# Patient Record
Sex: Female | Born: 1950 | Race: White | Hispanic: No | Marital: Married | Smoking: Never smoker
Health system: Southern US, Community
[De-identification: ages and names within clinical notes are randomized; demographics above are authoritative.]

## PROBLEM LIST (undated history)

## (undated) DIAGNOSIS — C50919 Malignant neoplasm of unspecified site of unspecified female breast: Secondary | ICD-10-CM

## (undated) DIAGNOSIS — F40240 Claustrophobia: Secondary | ICD-10-CM

## (undated) DIAGNOSIS — Z8739 Personal history of other diseases of the musculoskeletal system and connective tissue: Secondary | ICD-10-CM

## (undated) DIAGNOSIS — M1990 Unspecified osteoarthritis, unspecified site: Secondary | ICD-10-CM

## (undated) DIAGNOSIS — M797 Fibromyalgia: Secondary | ICD-10-CM

## (undated) DIAGNOSIS — D649 Anemia, unspecified: Secondary | ICD-10-CM

## (undated) HISTORY — DX: Personal history of other diseases of the musculoskeletal system and connective tissue: Z87.39

## (undated) HISTORY — PX: COLONOSCOPY: SHX174

## (undated) HISTORY — DX: Claustrophobia: F40.240

## (undated) HISTORY — PX: KNEE ARTHROSCOPY: SUR90

## (undated) HISTORY — PX: FINGER SURGERY: SHX640

## (undated) HISTORY — PX: KNEE SURGERY: SHX244

## (undated) HISTORY — PX: BUNIONECTOMY: SHX129

---

## 1999-05-07 ENCOUNTER — Encounter: Payer: Self-pay | Admitting: Obstetrics and Gynecology

## 1999-05-07 ENCOUNTER — Encounter: Admission: RE | Admit: 1999-05-07 | Discharge: 1999-05-07 | Payer: Self-pay | Admitting: Obstetrics and Gynecology

## 1999-07-26 ENCOUNTER — Other Ambulatory Visit: Admission: RE | Admit: 1999-07-26 | Discharge: 1999-07-26 | Payer: Self-pay | Admitting: Obstetrics and Gynecology

## 2000-06-24 ENCOUNTER — Encounter: Payer: Self-pay | Admitting: Obstetrics and Gynecology

## 2000-06-24 ENCOUNTER — Encounter: Admission: RE | Admit: 2000-06-24 | Discharge: 2000-06-24 | Payer: Self-pay | Admitting: Obstetrics and Gynecology

## 2011-07-30 ENCOUNTER — Other Ambulatory Visit: Payer: Self-pay | Admitting: Rheumatology

## 2011-07-30 DIAGNOSIS — M545 Low back pain, unspecified: Secondary | ICD-10-CM

## 2011-08-13 ENCOUNTER — Ambulatory Visit
Admission: RE | Admit: 2011-08-13 | Discharge: 2011-08-13 | Disposition: A | Discharge status: Home / Self Care | Payer: Private Health Insurance - Indemnity | Source: Ambulatory Visit | Attending: Rheumatology | Admitting: Rheumatology

## 2011-08-13 DIAGNOSIS — M545 Low back pain, unspecified: Secondary | ICD-10-CM

## 2011-11-22 ENCOUNTER — Ambulatory Visit (INDEPENDENT_AMBULATORY_CARE_PROVIDER_SITE_OTHER): Payer: Private Health Insurance - Indemnity | Admitting: Internal Medicine

## 2011-11-22 VITALS — BP 133/77 | HR 80 | Temp 98.1°F | Resp 18 | Ht 71.0 in | Wt 143.0 lb

## 2011-11-22 DIAGNOSIS — H9209 Otalgia, unspecified ear: Secondary | ICD-10-CM

## 2011-11-22 MED ORDER — NEOMYCIN-POLYMYXIN-HC 3.5-10000-1 OT SOLN: 3.0000 [drp] | Freq: Four times a day (QID) | OTIC | Status: AC

## 2011-11-24 DIAGNOSIS — M797 Fibromyalgia: Secondary | ICD-10-CM | POA: Insufficient documentation

## 2011-11-24 DIAGNOSIS — M5136 Other intervertebral disc degeneration, lumbar region: Secondary | ICD-10-CM | POA: Insufficient documentation

## 2011-11-24 DIAGNOSIS — M51369 Other intervertebral disc degeneration, lumbar region without mention of lumbar back pain or lower extremity pain: Secondary | ICD-10-CM | POA: Insufficient documentation

## 2011-11-24 NOTE — Progress Notes (Signed)
  Subjective:    Patient ID: Jodi Gutierrez, female    DOB: 1951/05/03, 61 y.o.   MRN: [id number]  HPIComplaining of otalgia for several days on the right Occasional rustling sound in the ear No decreased hearing/no URI or allergies/no recent infections anywhere/no dental problems Has had some trouble with shoulder pain on that side and posterior neck pain on that side with activity over the past few weeks  Medical history includes fibromyalgia and lumbar degenerative disc disease/relatively stable    Review of Systems     Objective:   Physical Exam Vital signs stable Right and left TMs and canals are clear Except for mild accumulation of flaky wax in the canal on the right/negative tragus pull/no other exudate or redness The nose is clear Oral pharynx is clear No anterior posterior cervical nodes/no thyromegaly Tender in the right trapezius/mild tenderness in the posterior shoulder with range of motion/ Tender along the right paracervical muscles posteriorly/neck range of motion is good       Assessment & Plan:  Problem #1 otalgia of uncertain cause Problem #2 neck pain Problem #3 right shoulder pain  Meds ordered this encounter  Medications  . amitriptyline (ELAVIL) 50 MG tablet    Sig: Take 50 mg by mouth at bedtime.---Continued  . estradiol (CLIMARA - DOSED IN MG/24 HR) 0.025 mg/24hr    Sig: Place 1 patch onto the skin once a week.----Continued  . traMADol (ULTRAM) 50 MG tablet----Continued    Sig: Take 50 mg by mouth every 6 (six) hours as needed.  . cyclobenzaprine (FLEXERIL) 10 MG tablet--Continued    Sig: Take 10 mg by mouth 3 (three) times daily as needed.  . neomycin-polymyxin-hydrocortisone (CORTISPORIN) otic solution    Sig: Place 3 drops into the right ear 4 (four) times daily.    Dispense:  10 mL    Refill:  0   If not resolved in 5-7 days recommend recheck/center if worse

## 2012-06-09 ENCOUNTER — Encounter: Payer: Self-pay | Admitting: Internal Medicine

## 2012-06-16 ENCOUNTER — Encounter: Payer: Self-pay | Admitting: Internal Medicine

## 2012-06-16 ENCOUNTER — Ambulatory Visit (AMBULATORY_SURGERY_CENTER): Payer: Private Health Insurance - Indemnity

## 2012-06-16 VITALS — Ht 71.0 in | Wt 151.8 lb

## 2012-06-16 DIAGNOSIS — Z1211 Encounter for screening for malignant neoplasm of colon: Secondary | ICD-10-CM

## 2012-06-16 DIAGNOSIS — Z8371 Family history of colonic polyps: Secondary | ICD-10-CM

## 2012-06-16 DIAGNOSIS — Z800 Family history of malignant neoplasm of digestive organs: Secondary | ICD-10-CM

## 2012-06-16 MED ORDER — MOVIPREP 100 G PO SOLR: ORAL | Status: DC

## 2012-06-30 ENCOUNTER — Ambulatory Visit (AMBULATORY_SURGERY_CENTER): Payer: Private Health Insurance - Indemnity | Admitting: Internal Medicine

## 2012-06-30 ENCOUNTER — Encounter: Payer: Self-pay | Admitting: Internal Medicine

## 2012-06-30 VITALS — BP 125/75 | HR 83 | Temp 96.9°F | Resp 13 | Ht 71.0 in | Wt 151.0 lb

## 2012-06-30 DIAGNOSIS — K633 Ulcer of intestine: Secondary | ICD-10-CM

## 2012-06-30 DIAGNOSIS — D126 Benign neoplasm of colon, unspecified: Secondary | ICD-10-CM

## 2012-06-30 DIAGNOSIS — Z800 Family history of malignant neoplasm of digestive organs: Secondary | ICD-10-CM

## 2012-06-30 DIAGNOSIS — Z1211 Encounter for screening for malignant neoplasm of colon: Secondary | ICD-10-CM

## 2012-06-30 MED ORDER — SODIUM CHLORIDE 0.9 % IV SOLN: 500.0000 mL | INTRAVENOUS | Status: DC

## 2012-06-30 NOTE — Progress Notes (Addendum)
Patient did not have preoperative order for IV antibiotic SSI prophylaxis. (G8918)  Patient did not experience any of the following events: a burn prior to discharge; a fall within the facility; wrong site/side/patient/procedure/implant event; or a hospital transfer or hospital admission upon discharge from the facility. (G8907)  

## 2012-06-30 NOTE — Progress Notes (Signed)
Called to room to assist during endoscopic procedure.  Patient ID and intended procedure confirmed with present staff. Received instructions for my participation in the procedure from the performing physician.  

## 2012-06-30 NOTE — Op Note (Signed)
[facility] [address] [place], [address]   COLONOSCOPY PROCEDURE REPORT  PATIENT: Jodi Gutierrez, Jodi Gutierrez  MR#: [id number] BIRTHDATE: 1950-12-09 , 61  yrs. old GENDER: Female ENDOSCOPIST: Hart Carwin, MD REFERRED BY:  Dr Orvan July PROCEDURE DATE:  06/30/2012 PROCEDURE:   Colonoscopy with biopsy ASA CLASS:   Class I INDICATIONS: Sister with colon cancer, brother with colon polyps MEDICATIONS: MAC sedation, administered by CRNA and propofol (Diprivan) 350mg  IV  DESCRIPTION OF PROCEDURE:   After the risks and benefits and of the procedure were explained, informed consent was obtained.  A digital rectal exam revealed no abnormalities of the rectum.    The LB [id number]-[id number]  endoscope was introduced through the anus and advanced to the cecum, which was identified by both the appendix and ileocecal valve .  The quality of the prep was excellent, using MoviPrep .  The instrument was then slowly withdrawn as the colon was fully examined.     COLON FINDINGS: A small ulcer /linear erosion was found in the proximal ascending colon.on top of ileo cecal valve  A biopsy of the area was performed using cold forceps. There was severer divericulosis of the sigmoid colon between 15- 25 cm, lumen was narrow, tortuous and there were deep diverticluti. it was very difficult to advance into descending colon    Retroflexed views revealed no abnormalities.     The scope was then withdrawn from the patient and the procedure completed.  COMPLICATIONS: There were no complications. ENDOSCOPIC IMPRESSION:  Severe diverticulosis of the sigmoid colon, partial colon obstruction, no active inflammation Small ulcer in the proximal ascending colon; biopsy of the area was performed using cold forceps  RECOMMENDATIONS: Await biopsy results add Metamucil 1 tsp daily   REPEAT EXAM: In 5 year(s)  for Colonoscopy.  cc:  _______________________________ eSignedHart Carwin, MD  06/30/2012 2:14 PM     PATIENT NAME:  Jodi Gutierrez, Jodi Gutierrez MR#: [id number]

## 2012-06-30 NOTE — Patient Instructions (Addendum)
YOU HAD AN ENDOSCOPIC PROCEDURE TODAY AT THE [facility]: Refer to the procedure report that was given to you for any specific questions about what was found during the examination.  If the procedure report does not answer your questions, please call your gastroenterologist to clarify.  If you requested that your care partner not be given the details of your procedure findings, then the procedure report has been included in a sealed envelope for you to review at your convenience later.  YOU SHOULD EXPECT: Some feelings of bloating in the abdomen. Passage of more gas than usual.  Walking can help get rid of the air that was put into your GI tract during the procedure and reduce the bloating. If you had a lower endoscopy (such as a colonoscopy or flexible sigmoidoscopy) you may notice spotting of blood in your stool or on the toilet paper. If you underwent a bowel prep for your procedure, then you may not have a normal bowel movement for a few days.  DIET: Your first meal following the procedure should be a light meal and then it is ok to progress to your normal diet.  A half-sandwich or bowl of soup is an example of a good first meal.  Heavy or fried foods are harder to digest and may make you feel nauseous or bloated.  Likewise meals heavy in dairy and vegetables can cause extra gas to form and this can also increase the bloating.  Drink plenty of fluids but you should avoid alcoholic beverages for 24 hours. Please, increase the fiber in your diet to prevent diverticulitis.  It's very important.  Soluable fiber is better.  ACTIVITY: Your care partner should take you home directly after the procedure.  You should plan to take it easy, moving slowly for the rest of the day.  You can resume normal activity the day after the procedure however you should NOT DRIVE or use heavy machinery for 24 hours (because of the sedation medicines used during the test).    SYMPTOMS TO REPORT IMMEDIATELY: A  gastroenterologist can be reached at any hour.  During normal business hours, 8:30 AM to 5:00 PM Monday through Friday, call [phone].  After hours and on weekends, please call the GI answering service at [phone] who will take a message and have the physician on call contact you.   Following lower endoscopy (colonoscopy or flexible sigmoidoscopy):  Excessive amounts of blood in the stool  Significant tenderness or worsening of abdominal pains  Swelling of the abdomen that is new, acute  Fever of 100F or higher FOLLOW UP: If any biopsies were taken you will be contacted by phone or by letter within the next 1-3 weeks.  Call your gastroenterologist if you have not heard about the biopsies in 3 weeks.  Our staff will call the home number listed on your records the next business day following your procedure to check on you and address any questions or concerns that you may have at that time regarding the information given to you following your procedure. This is a courtesy call and so if there is no answer at the home number and we have not heard from you through the emergency physician on call, we will assume that you have returned to your regular daily activities without incident.  SIGNATURES/CONFIDENTIALITY: You and/or your care partner have signed paperwork which will be entered into your electronic medical record.  These signatures attest to the fact that that the information above on  your After Visit Summary has been reviewed and is understood.  Full responsibility of the confidentiality of this discharge information lies with you and/or your care-partner.    Thank-you for choosing [place] for your medical care today.

## 2012-07-01 ENCOUNTER — Telehealth: Payer: Self-pay | Admitting: *Deleted

## 2012-07-01 NOTE — Telephone Encounter (Signed)
  Follow up Call-  Call back number 06/30/2012  Post procedure Call Back phone  # [phone]  Permission to leave phone message Yes     No answer, left message!!

## 2012-07-06 ENCOUNTER — Encounter: Payer: Self-pay | Admitting: Internal Medicine

## 2012-08-21 ENCOUNTER — Telehealth: Payer: Self-pay | Admitting: Physician Assistant

## 2012-08-21 MED ORDER — ESTRADIOL 0.025 MG/24HR TD PTWK: 1.0000 | MEDICATED_PATCH | TRANSDERMAL | Status: DC

## 2012-08-21 NOTE — Telephone Encounter (Signed)
Medication refilled per protocol. 

## 2013-02-26 ENCOUNTER — Telehealth: Payer: Self-pay | Admitting: *Deleted

## 2013-02-26 NOTE — Telephone Encounter (Signed)
Pt complains of continue problem with left foot and wishes to discuss with Dr Irving Shows. Last office visit 11/13/2012.  1157am Called pt, pt states she is driving, I quickly informed her it would be to her best advantage to schedule appt.  Pt agreed.

## 2013-03-04 ENCOUNTER — Ambulatory Visit (INDEPENDENT_AMBULATORY_CARE_PROVIDER_SITE_OTHER): Payer: Private Health Insurance - Indemnity

## 2013-03-04 ENCOUNTER — Encounter: Payer: Self-pay | Admitting: Podiatrist

## 2013-03-04 ENCOUNTER — Ambulatory Visit (INDEPENDENT_AMBULATORY_CARE_PROVIDER_SITE_OTHER): Payer: Private Health Insurance - Indemnity | Admitting: Podiatrist

## 2013-03-04 VITALS — BP 138/91 | HR 98 | Resp 16 | Ht 71.0 in | Wt 144.0 lb

## 2013-03-04 DIAGNOSIS — Z9889 Other specified postprocedural states: Secondary | ICD-10-CM

## 2013-03-04 NOTE — Patient Instructions (Signed)
I have referred you to Jodi Gutierrez for physical therapy And nerve desensitization for your left foot.

## 2013-03-04 NOTE — Progress Notes (Signed)
Subjective: Patient presents today for her postop followup regarding left foot bunion surgery. She's been having nerve pain on this left foot and she points to the first metatarsal head region medially. Patient has tried her TENS unit and relates minimal relief in pain. She describes pain is shooting sharp pain in nervelike pain. Objective: Neurovascular status is intact and unchanged. Excellent range of motion of the first metatarsophalangeal joint left is seen. Swelling is completely resolved on the left foot. Tingling and numbness experiencde with pressure along the medial head of the first metatarsal. X-rays are evaluated and reveal well-healed Austin bunion correction left foot. Assessment is neurapraxia status post bunion procedure left Plan: Recommended physical therapy with Irene Shipper. [facility].  A prescription was written for this physical therapy she was also instructed to take her TENS unit with her in case he may want to use it for therapy. She is to continue seeing him and if she does not have resolution symptoms she shall call and let me know. Discussed we may be able to go in and free the nerve that they have gotten scarred down after surgery. She demonstrates an understanding of this conversation and will be seen back when necessary  Marlowe Aschoff DPM

## 2013-06-04 ENCOUNTER — Telehealth: Payer: Self-pay | Admitting: *Deleted

## 2013-06-04 NOTE — Telephone Encounter (Signed)
Left message asking pt how she was doing after her PT, and to call with concerns.

## 2013-09-09 ENCOUNTER — Telehealth: Payer: Self-pay | Admitting: *Deleted

## 2013-09-09 NOTE — Telephone Encounter (Signed)
Pain has come back.  Can she send me back to [facility] again?  He was able to make pain go away in October.

## 2013-09-09 NOTE — Telephone Encounter (Signed)
Absolutely-- please send over a referral to [facility] please

## 2013-09-10 NOTE — Telephone Encounter (Signed)
I called and informed the patient that we are sending a referral to [facility] for physical therapy.  [facility] [address] [place], [place] [address] Ph: [phone]

## 2014-01-19 NOTE — H&P (Signed)
TOTAL KNEE ADMISSION H&P  Patient is being admitted for right total knee arthroplasty.  Subjective:  Chief Complaint:     Right knee OA / pain.  HPI: AFFIE GASNER, 63 y.o. female, has a history of pain and functional disability in the right knee due to arthritis and has failed non-surgical conservative treatments for greater than 12 weeks to include NSAID's and/or analgesics, corticosteriod injections, viscosupplementation injections, supervised PT with diminished ADL's post treatment and activity modification.  Onset of symptoms was gradual, starting  years ago with gradually worsening course since that time. The patient noted prior procedures on the knee to include  arthroscopy and menisectomy on the right knee(s).  Patient currently rates pain in the right knee(s) at 9 out of 10 with activity. Patient has night pain, worsening of pain with activity and weight bearing, pain that interferes with activities of daily living, pain with passive range of motion, crepitus and joint swelling.  Patient has evidence of periarticular osteophytes and joint space narrowing by imaging studies.  There is no active infection.  Risks, benefits and expectations were discussed with the patient.  Risks including but not limited to the risk of anesthesia, blood clots, nerve damage, blood vessel damage, failure of the prosthesis, infection and up to and including death.  Patient understand the risks, benefits and expectations and wishes to proceed with surgery.   PCP: Anthoney Harada, MD  D/C Plans:      Home with HHPT  Post-op Meds:       No Rx given  Tranexamic Acid:      To be given - IV    Decadron:      Is to be given  FYI:     Xarelto post-op(Doesn't tolerate ASA at all)  Norco post-op    Patient Active Problem List   Diagnosis Date Noted  . Fibromyalgia 11/24/2011  . Lumbar degenerative disc disease 11/24/2011   Past Medical History  Diagnosis Date  . H/O fibromyalgia   . Claustrophobia      Past Surgical History  Procedure Laterality Date  . Bunionectomy      bil  . Knee surgery      left / arthroscopic  . Finger surgery      right ring finger /1994  . Knee arthroscopy Right     No prescriptions prior to admission   Allergies  Allergen Reactions  . Silver Other (See Comments)    swelling and blisters  . Tape Rash and Other (See Comments)    redness  . Nickel   . Penicillins     History  Substance Use Topics  . Smoking status: Never Smoker   . Smokeless tobacco: Never Used  . Alcohol Use: 0.6 oz/week    1 Glasses of wine per week     Comment: four times a week    Family History  Problem Relation Age of Onset  . Colon polyps Brother   . Colon cancer Sister      Review of Systems  Constitutional: Negative.   HENT: Negative.   Eyes: Negative.   Respiratory: Negative.   Cardiovascular: Negative.   Gastrointestinal: Negative.   Genitourinary: Negative.   Musculoskeletal: Positive for back pain, joint pain and myalgias.  Skin: Negative.   Neurological: Negative.   Endo/Heme/Allergies: Negative.   Psychiatric/Behavioral: Negative.     Objective:  Physical Exam  Constitutional: She is oriented to person, place, and time. She appears well-developed and well-nourished.  HENT:  Head: Normocephalic and atraumatic.  Mouth/Throat: Oropharynx is clear and moist.  Eyes: Conjunctivae are normal. Pupils are equal, round, and reactive to light.  Neck: Neck supple. No JVD present. No tracheal deviation present. No thyromegaly present.  Cardiovascular: Normal rate, regular rhythm, normal heart sounds and intact distal pulses.   Respiratory: Effort normal and breath sounds normal. No stridor. No respiratory distress. She has no wheezes.  GI: Soft. There is no tenderness. There is no guarding.  Musculoskeletal:       Right knee: She exhibits decreased range of motion, swelling and bony tenderness. She exhibits no ecchymosis, no deformity, no laceration, no  erythema and normal alignment. Tenderness found.  Lymphadenopathy:    She has no cervical adenopathy.  Neurological: She is alert and oriented to person, place, and time.  Skin: Skin is warm and dry.  Psychiatric: She has a normal mood and affect.     Labs:  Estimated body mass index is 20.09 kg/(m^2) as calculated from the following:   Height as of 03/04/13: 5\' 11"  (1.803 m).   Weight as of 03/04/13: 65.318 kg (144 lb).   Imaging Review Plain radiographs demonstrate severe degenerative joint disease of the right knee(s). The overall alignment is neutral. The bone quality appears to be good for age and reported activity level.  Assessment/Plan:  End stage arthritis, right knee   The patient history, physical examination, clinical judgment of the provider and imaging studies are consistent with end stage degenerative joint disease of the right knee(s) and total knee arthroplasty is deemed medically necessary. The treatment options including medical management, injection therapy arthroscopy and arthroplasty were discussed at length. The risks and benefits of total knee arthroplasty were presented and reviewed. The risks due to aseptic loosening, infection, stiffness, patella tracking problems, thromboembolic complications and other imponderables were discussed. The patient acknowledged the explanation, agreed to proceed with the plan and consent was signed. Patient is being admitted for inpatient treatment for surgery, pain control, PT, OT, prophylactic antibiotics, VTE prophylaxis, progressive ambulation and ADL's and discharge planning. The patient is planning to be discharged home with home health services.    [place] Pugh Loneta Tamplin   PA-C  01/19/2014, 6:08 PM

## 2014-02-03 ENCOUNTER — Encounter (HOSPITAL_COMMUNITY): Payer: Self-pay | Admitting: Pharmacy Technician

## 2014-02-09 NOTE — Patient Instructions (Addendum)
Jodi Gutierrez  02/09/2014   Your procedure is scheduled on:  02/15/2014    Report to [facility].  Follow the Signs to Max at     [phone]     am  Call this number if you have problems the morning of surgery: [phone]   Remember:   Do not eat food after midnite.  May have clear liquids until 0600am then nothing by mouth.   Take these medicines the morning of surgery with A SIP OF WATER: none   Do not wear jewelry, make-up or nail polish.  Do not wear lotions, powders, or perfumes.  deodorant.  Do not shave 48 hours prior to surgery.   Do not bring valuables to the hospital.  Contacts, dentures or bridgework may not be worn into surgery.  Leave suitcase in the car. After surgery it may be brought to your room.  For patients admitted to the hospital, checkout time is 11:00 AM the day of  discharge.     :      [place] Allowed                                                                     Foods Excluded  Coffee and tea, regular and decaf                             liquids that you cannot  Plain Jell-O in any flavor                                             see through such as: Fruit ices (not with fruit pulp)                                     milk, soups, orange juice  Iced Popsicles                                    All solid food Carbonated beverages, regular and diet                                    Cranberry, grape and apple juices Sports drinks like Gatorade Lightly seasoned clear broth or consume(fat free) Sugar, honey syrup  Sample Menu Breakfast                                Lunch                                     Supper Cranberry juice                    Beef broth  Chicken broth Jell-O                                     Grape juice                           Apple juice Coffee or tea                        Jell-O                                      Popsicle                                                 Coffee or tea                        Coffee or tea  _____________________________________________________________________  [facility] - Preparing for Surgery Before surgery, you can play an important role.  Because skin is not sterile, your skin needs to be as free of germs as possible.  You can reduce the number of germs on your skin by washing with CHG (chlorahexidine gluconate) soap before surgery.  CHG is an antiseptic cleaner which kills germs and bonds with the skin to continue killing germs even after washing. Please DO NOT use if you have an allergy to CHG or antibacterial soaps.  If your skin becomes reddened/irritated stop using the CHG and inform your nurse when you arrive at Short Stay. Do not shave (including legs and underarms) for at least 48 hours prior to the first CHG shower.  You may shave your face/neck. Please follow these instructions carefully:  1.  Shower with CHG Soap the night before surgery and the  morning of Surgery.  2.  If you choose to wash your hair, wash your hair first as usual with your  normal  shampoo.  3.  After you shampoo, rinse your hair and body thoroughly to remove the  shampoo.                           4.  Use CHG as you would any other liquid soap.  You can apply chg directly  to the skin and wash                       Gently with a scrungie or clean washcloth.  5.  Apply the CHG Soap to your body ONLY FROM THE NECK DOWN.   Do not use on face/ open                           Wound or open sores. Avoid contact with eyes, ears mouth and genitals (private parts).                       Wash face,  Genitals (private parts) with your normal soap.             6.  Wash thoroughly, paying special attention to the area  where your surgery  will be performed.  7.  Thoroughly rinse your body with warm water from the neck down.  8.  DO NOT shower/wash with your normal soap after using and rinsing off  the CHG Soap.                9.  Pat  yourself dry with a clean towel.            10.  Wear clean pajamas.            11.  Place clean sheets on your bed the night of your first shower and do not  sleep with pets. Day of Surgery : Do not apply any lotions/deodorants the morning of surgery.  Please wear clean clothes to the hospital/surgery center.  FAILURE TO FOLLOW THESE INSTRUCTIONS MAY RESULT IN THE CANCELLATION OF YOUR SURGERY PATIENT SIGNATURE_________________________________  NURSE SIGNATURE__________________________________  ________________________________________________________________________  WHAT IS A BLOOD TRANSFUSION? Blood Transfusion Information  A transfusion is the replacement of blood or some of its parts. Blood is made up of multiple cells which provide different functions.  Red blood cells carry oxygen and are used for blood loss replacement.  White blood cells fight against infection.  Platelets control bleeding.  Plasma helps clot blood.  Other blood products are available for specialized needs, such as hemophilia or other clotting disorders. BEFORE THE TRANSFUSION  Who gives blood for transfusions?   Healthy volunteers who are fully evaluated to make sure their blood is safe. This is blood bank blood. Transfusion therapy is the safest it has ever been in the practice of medicine. Before blood is taken from a donor, a complete history is taken to make sure that person has no history of diseases nor engages in risky social behavior (examples are intravenous drug use or sexual activity with multiple partners). The donor's travel history is screened to minimize risk of transmitting infections, such as malaria. The donated blood is tested for signs of infectious diseases, such as HIV and hepatitis. The blood is then tested to be sure it is compatible with you in order to minimize the chance of a transfusion reaction. If you or a relative donates blood, this is often done in anticipation of surgery and is  not appropriate for emergency situations. It takes many days to process the donated blood. RISKS AND COMPLICATIONS Although transfusion therapy is very safe and saves many lives, the main dangers of transfusion include:   Getting an infectious disease.  Developing a transfusion reaction. This is an allergic reaction to something in the blood you were given. Every precaution is taken to prevent this. The decision to have a blood transfusion has been considered carefully by your caregiver before blood is given. Blood is not given unless the benefits outweigh the risks. AFTER THE TRANSFUSION  Right after receiving a blood transfusion, you will usually feel much better and more energetic. This is especially true if your red blood cells have gotten low (anemic). The transfusion raises the level of the red blood cells which carry oxygen, and this usually causes an energy increase.  The nurse administering the transfusion will monitor you carefully for complications. HOME CARE INSTRUCTIONS  No special instructions are needed after a transfusion. You may find your energy is better. Speak with your caregiver about any limitations on activity for underlying diseases you may have. SEEK MEDICAL CARE IF:   Your condition is not improving after your transfusion.  You develop redness or irritation at the intravenous (  IV) site. SEEK IMMEDIATE MEDICAL CARE IF:  Any of the following symptoms occur over the next 12 hours:  Shaking chills.  You have a temperature by mouth above 102 F (38.9 C), not controlled by medicine.  Chest, back, or muscle pain.  People around you feel you are not acting correctly or are confused.  Shortness of breath or difficulty breathing.  Dizziness and fainting.  You get a rash or develop hives.  You have a decrease in urine output.  Your urine turns a dark color or changes to pink, red, or brown. Any of the following symptoms occur over the next 10 days:  You have  a temperature by mouth above 102 F (38.9 C), not controlled by medicine.  Shortness of breath.  Weakness after normal activity.  The white part of the eye turns yellow (jaundice).  You have a decrease in the amount of urine or are urinating less often.  Your urine turns a dark color or changes to pink, red, or brown. Document Released: 05/10/2000 Document Revised: 08/05/2011 Document Reviewed: 12/28/2007 ExitCare Patient Information 2014 [place].  _______________________________________________________________________  Incentive Spirometer  An incentive spirometer is a tool that can help keep your lungs clear and active. This tool measures how well you are filling your lungs with each breath. Taking long deep breaths may help reverse or decrease the chance of developing breathing (pulmonary) problems (especially infection) following:  A long period of time when you are unable to move or be active. BEFORE THE PROCEDURE   If the spirometer includes an indicator to show your best effort, your nurse or respiratory therapist will set it to a desired goal.  If possible, sit up straight or lean slightly forward. Try not to slouch.  Hold the incentive spirometer in an upright position. INSTRUCTIONS FOR USE  1. Sit on the edge of your bed if possible, or sit up as far as you can in bed or on a chair. 2. Hold the incentive spirometer in an upright position. 3. Breathe out normally. 4. Place the mouthpiece in your mouth and seal your lips tightly around it. 5. Breathe in slowly and as deeply as possible, raising the piston or the ball toward the top of the column. 6. Hold your breath for 3-5 seconds or for as long as possible. Allow the piston or ball to fall to the bottom of the column. 7. Remove the mouthpiece from your mouth and breathe out normally. 8. Rest for a few seconds and repeat Steps 1 through 7 at least 10 times every 1-2 hours when you are awake. Take your time and  take a few normal breaths between deep breaths. 9. The spirometer may include an indicator to show your best effort. Use the indicator as a goal to work toward during each repetition. 10. After each set of 10 deep breaths, practice coughing to be sure your lungs are clear. If you have an incision (the cut made at the time of surgery), support your incision when coughing by placing a pillow or rolled up towels firmly against it. Once you are able to get out of bed, walk around indoors and cough well. You may stop using the incentive spirometer when instructed by your caregiver.  RISKS AND COMPLICATIONS  Take your time so you do not get dizzy or light-headed.  If you are in pain, you may need to take or ask for pain medication before doing incentive spirometry. It is harder to take a deep breath if you  are having pain. AFTER USE  Rest and breathe slowly and easily.  It can be helpful to keep track of a log of your progress. Your caregiver can provide you with a simple table to help with this. If you are using the spirometer at home, follow these instructions: [place] IF:   You are having difficultly using the spirometer.  You have trouble using the spirometer as often as instructed.  Your pain medication is not giving enough relief while using the spirometer.  You develop fever of 100.5 F (38.1 C) or higher. SEEK IMMEDIATE MEDICAL CARE IF:   You cough up bloody sputum that had not been present before.  You develop fever of 102 F (38.9 C) or greater.  You develop worsening pain at or near the incision site. MAKE SURE YOU:   Understand these instructions.  Will watch your condition.  Will get help right away if you are not doing well or get worse. Document Released: 09/23/2006 Document Revised: 08/05/2011 Document Reviewed: 11/24/2006 ExitCare Patient Information 2014 ExitCare,  [place].   ________________________________________________________________________    Please read over the following fact sheets that you were given: MRSA Information, coughing and deep breathing exercises, leg exercises

## 2014-02-09 NOTE — Progress Notes (Signed)
Consent nreads for left knee not right knee which patient states is being done as well as note done by Adrian Prince, PAC.  Please place new consent order in EPIC.  Thank You.

## 2014-02-10 ENCOUNTER — Encounter (HOSPITAL_COMMUNITY)
Admission: RE | Admit: 2014-02-10 | Discharge: 2014-02-10 | Disposition: A | Discharge status: Home / Self Care | Payer: Managed Care, Other (non HMO) | Source: Ambulatory Visit | Attending: Orthopedic Surgery | Admitting: Orthopedic Surgery

## 2014-02-10 ENCOUNTER — Encounter (INDEPENDENT_AMBULATORY_CARE_PROVIDER_SITE_OTHER): Payer: Self-pay

## 2014-02-10 ENCOUNTER — Encounter (HOSPITAL_COMMUNITY): Payer: Self-pay

## 2014-02-10 DIAGNOSIS — Z01812 Encounter for preprocedural laboratory examination: Secondary | ICD-10-CM | POA: Insufficient documentation

## 2014-02-10 DIAGNOSIS — M1710 Unilateral primary osteoarthritis, unspecified knee: Secondary | ICD-10-CM | POA: Insufficient documentation

## 2014-02-10 HISTORY — DX: Fibromyalgia: M79.7

## 2014-02-10 HISTORY — DX: Anemia, unspecified: D64.9

## 2014-02-10 LAB — BASIC METABOLIC PANEL
Anion gap: 10 (ref 5–15)
BUN: 14 mg/dL (ref 6–23)
CO2: 28 meq/L (ref 19–32)
Calcium: 9.5 mg/dL (ref 8.4–10.5)
Chloride: 103 mEq/L (ref 96–112)
Creatinine, Ser: 0.82 mg/dL (ref 0.50–1.10)
GFR calc Af Amer: 85 mL/min — ABNORMAL LOW (ref >90)
GFR, EST NON AFRICAN AMERICAN: 73 mL/min — AB (ref >90)
GLUCOSE: 97 mg/dL (ref 70–99)
POTASSIUM: 4.3 meq/L (ref 3.7–5.3)
SODIUM: 141 meq/L (ref 137–147)

## 2014-02-10 LAB — CBC
HEMATOCRIT: 40.4 % (ref 36.0–46.0)
Hemoglobin: 14.2 g/dL (ref 12.0–15.0)
MCH: 32.2 pg (ref 26.0–34.0)
MCHC: 35.1 g/dL (ref 30.0–36.0)
MCV: 91.6 fL (ref 78.0–100.0)
Platelets: 247 10*3/uL (ref 150–400)
RBC: 4.41 MIL/uL (ref 3.87–5.11)
RDW: 12.3 % (ref 11.5–15.5)
WBC: 4.9 10*3/uL (ref 4.0–10.5)

## 2014-02-10 LAB — URINALYSIS, ROUTINE W REFLEX MICROSCOPIC
Bilirubin Urine: NEGATIVE
Glucose, UA: NEGATIVE mg/dL
HGB URINE DIPSTICK: NEGATIVE
KETONES UR: NEGATIVE mg/dL
Leukocytes, UA: NEGATIVE
Nitrite: NEGATIVE
Protein, ur: NEGATIVE mg/dL
Specific Gravity, Urine: 1.01 (ref 1.005–1.030)
UROBILINOGEN UA: 0.2 mg/dL (ref 0.0–1.0)
pH: 6.5 (ref 5.0–8.0)

## 2014-02-10 LAB — APTT: aPTT: 29 seconds (ref 24–37)

## 2014-02-10 LAB — SURGICAL PCR SCREEN
MRSA, PCR: NEGATIVE
Staphylococcus aureus: NEGATIVE

## 2014-02-10 LAB — PROTIME-INR
INR: 0.99 (ref 0.00–1.49)
Prothrombin Time: 13.1 seconds (ref 11.6–15.2)

## 2014-02-10 LAB — ABO/RH: ABO/RH(D): O POS

## 2014-02-10 NOTE — Progress Notes (Signed)
New surgery consent is correct per patient stating right knee but OR SCHEDULE still states left knee.  Left a voice mail for Glendale Chard  In regards to this.  Thank You.

## 2014-02-10 NOTE — Progress Notes (Signed)
Clearance 01/10/14 on chart from Dr Noah Delaine and EKG done 01/10/14.

## 2014-02-10 NOTE — Progress Notes (Addendum)
OR surgery corrected to say right TKA.  Patient aware of time change to 0855am-1005am and to arrive at 0600am.  Time was changed due to allergies and REP could be here at 0855am-1005am per OR.  Patient also aware nothing to eat or drink after midnite.

## 2014-02-14 NOTE — Anesthesia Preprocedure Evaluation (Addendum)
Anesthesia Evaluation  Patient identified by MRN, date of birth, ID band Patient awake    Reviewed: Allergy & Precautions, H&P , NPO status , Patient's Chart, lab work & pertinent test results  History of Anesthesia Complications Negative for: history of anesthetic complications  Airway Mallampati: II TM Distance: >3 FB Neck ROM: Full    Dental no notable dental hx.    Pulmonary neg pulmonary ROS,  breath sounds clear to auscultation  Pulmonary exam normal       Cardiovascular negative cardio ROS  Rhythm:Regular Rate:Normal     Neuro/Psych  Headaches, Anxiety  Neuromuscular disease    GI/Hepatic negative GI ROS, Neg liver ROS,   Endo/Other  negative endocrine ROS  Renal/GU negative Renal ROS  negative genitourinary   Musculoskeletal  (+) Arthritis -, Osteoarthritis,  Fibromyalgia -  Abdominal   Peds negative pediatric ROS (+)  Hematology  (+) anemia ,   Anesthesia Other Findings   Reproductive/Obstetrics negative OB ROS                           Anesthesia Physical Anesthesia Plan  ASA: II  Anesthesia Plan: Spinal   Post-op Pain Management:    Induction: Intravenous  Airway Management Planned: Nasal Cannula  Additional Equipment:   Intra-op Plan:   Post-operative Plan:   Informed Consent: I have reviewed the patients History and Physical, chart, labs and discussed the procedure including the risks, benefits and alternatives for the proposed anesthesia with the patient or authorized representative who has indicated his/her understanding and acceptance.   Dental advisory given  Plan Discussed with: CRNA  Anesthesia Plan Comments:         Anesthesia Quick Evaluation

## 2014-02-15 ENCOUNTER — Encounter (HOSPITAL_COMMUNITY): Payer: Self-pay | Admitting: *Deleted

## 2014-02-15 ENCOUNTER — Inpatient Hospital Stay (HOSPITAL_COMMUNITY)
Admission: RE | Admit: 2014-02-15 | Discharge: 2014-02-16 | DRG: 470 | Disposition: A | Discharge status: Home Health | Payer: Managed Care, Other (non HMO) | Source: Ambulatory Visit | Attending: Orthopedic Surgery | Admitting: Orthopedic Surgery

## 2014-02-15 ENCOUNTER — Encounter (HOSPITAL_COMMUNITY)
Admission: RE | Disposition: A | Discharge status: Home Health | Payer: Self-pay | Source: Ambulatory Visit | Attending: Orthopedic Surgery

## 2014-02-15 ENCOUNTER — Inpatient Hospital Stay (HOSPITAL_COMMUNITY): Payer: Managed Care, Other (non HMO) | Admitting: Anesthesiology

## 2014-02-15 ENCOUNTER — Encounter (HOSPITAL_COMMUNITY): Payer: Managed Care, Other (non HMO) | Admitting: Anesthesiology

## 2014-02-15 DIAGNOSIS — F40298 Other specified phobia: Secondary | ICD-10-CM | POA: Diagnosis present

## 2014-02-15 DIAGNOSIS — Z800 Family history of malignant neoplasm of digestive organs: Secondary | ICD-10-CM | POA: Diagnosis not present

## 2014-02-15 DIAGNOSIS — M1710 Unilateral primary osteoarthritis, unspecified knee: Principal | ICD-10-CM | POA: Diagnosis present

## 2014-02-15 DIAGNOSIS — M25569 Pain in unspecified knee: Secondary | ICD-10-CM | POA: Diagnosis present

## 2014-02-15 DIAGNOSIS — IMO0001 Reserved for inherently not codable concepts without codable children: Secondary | ICD-10-CM | POA: Diagnosis present

## 2014-02-15 DIAGNOSIS — M179 Osteoarthritis of knee, unspecified: Secondary | ICD-10-CM | POA: Diagnosis present

## 2014-02-15 DIAGNOSIS — Z8371 Family history of colonic polyps: Secondary | ICD-10-CM

## 2014-02-15 DIAGNOSIS — Z96651 Presence of right artificial knee joint: Secondary | ICD-10-CM

## 2014-02-15 DIAGNOSIS — Z01812 Encounter for preprocedural laboratory examination: Secondary | ICD-10-CM

## 2014-02-15 DIAGNOSIS — Z880 Allergy status to penicillin: Secondary | ICD-10-CM

## 2014-02-15 DIAGNOSIS — Z96659 Presence of unspecified artificial knee joint: Secondary | ICD-10-CM

## 2014-02-15 DIAGNOSIS — M25469 Effusion, unspecified knee: Secondary | ICD-10-CM | POA: Diagnosis present

## 2014-02-15 DIAGNOSIS — Z83719 Family history of colon polyps, unspecified: Secondary | ICD-10-CM

## 2014-02-15 HISTORY — PX: TOTAL KNEE ARTHROPLASTY: SHX125

## 2014-02-15 LAB — TYPE AND SCREEN
ABO/RH(D): O POS
Antibody Screen: NEGATIVE

## 2014-02-15 SURGERY — ARTHROPLASTY, KNEE, TOTAL
Anesthesia: Spinal | Site: Knee | Laterality: Right

## 2014-02-15 MED ORDER — MIDAZOLAM HCL 2 MG/2ML IJ SOLN
INTRAMUSCULAR | Status: AC
  Filled 2014-02-15: qty 2

## 2014-02-15 MED ORDER — HYDROMORPHONE HCL 1 MG/ML IJ SOLN
0.5000 mg | INTRAMUSCULAR | Status: DC | PRN
Start: 2014-02-15 — End: 2014-02-16
  Administered 2014-02-15: 0.5 mg via INTRAVENOUS
  Administered 2014-02-16: 1 mg via INTRAVENOUS
  Filled 2014-02-15 (×2): qty 1

## 2014-02-15 MED ORDER — ONDANSETRON HCL 4 MG/2ML IJ SOLN
INTRAMUSCULAR | Status: AC
  Filled 2014-02-15: qty 2

## 2014-02-15 MED ORDER — LACTATED RINGERS IV SOLN
INTRAVENOUS | Status: DC | PRN
  Administered 2014-02-15 (×2): via INTRAVENOUS

## 2014-02-15 MED ORDER — SODIUM CHLORIDE 0.9 % IJ SOLN
INTRAMUSCULAR | Status: AC
  Filled 2014-02-15: qty 10

## 2014-02-15 MED ORDER — MENTHOL 3 MG MT LOZG
1.0000 | LOZENGE | OROMUCOSAL | Status: DC | PRN
  Filled 2014-02-15: qty 9

## 2014-02-15 MED ORDER — EPHEDRINE SULFATE 50 MG/ML IJ SOLN
INTRAMUSCULAR | Status: AC
  Filled 2014-02-15: qty 1

## 2014-02-15 MED ORDER — PROPOFOL 10 MG/ML IV BOLUS
INTRAVENOUS | Status: AC
  Filled 2014-02-15: qty 20

## 2014-02-15 MED ORDER — BUPIVACAINE LIPOSOME 1.3 % IJ SUSP
20.0000 mL | Freq: Once | INTRAMUSCULAR | Status: AC
  Administered 2014-02-15: 20 mL
  Filled 2014-02-15: qty 20

## 2014-02-15 MED ORDER — DIPHENHYDRAMINE HCL 25 MG PO CAPS: 25.0000 mg | ORAL_CAPSULE | Freq: Four times a day (QID) | ORAL | Status: DC | PRN

## 2014-02-15 MED ORDER — FENTANYL CITRATE 0.05 MG/ML IJ SOLN: 25.0000 ug | INTRAMUSCULAR | Status: DC | PRN

## 2014-02-15 MED ORDER — LACTATED RINGERS IV SOLN
INTRAVENOUS | Status: DC
  Administered 2014-02-15: 1000 mL via INTRAVENOUS

## 2014-02-15 MED ORDER — FERROUS SULFATE 325 (65 FE) MG PO TABS
325.0000 mg | ORAL_TABLET | Freq: Three times a day (TID) | ORAL | Status: DC
  Administered 2014-02-16 (×2): 325 mg via ORAL
  Filled 2014-02-15 (×4): qty 1

## 2014-02-15 MED ORDER — PROMETHAZINE HCL 25 MG/ML IJ SOLN: 6.2500 mg | INTRAMUSCULAR | Status: DC | PRN

## 2014-02-15 MED ORDER — METOCLOPRAMIDE HCL 10 MG PO TABS: 5.0000 mg | ORAL_TABLET | Freq: Three times a day (TID) | ORAL | Status: DC | PRN

## 2014-02-15 MED ORDER — MIDAZOLAM HCL 5 MG/5ML IJ SOLN
INTRAMUSCULAR | Status: DC | PRN
  Administered 2014-02-15 (×2): 1 mg via INTRAVENOUS
  Administered 2014-02-15: 2 mg via INTRAVENOUS

## 2014-02-15 MED ORDER — ACETAMINOPHEN 650 MG RE SUPP: 650.0000 mg | Freq: Four times a day (QID) | RECTAL | Status: DC | PRN

## 2014-02-15 MED ORDER — CELECOXIB 200 MG PO CAPS
200.0000 mg | ORAL_CAPSULE | Freq: Two times a day (BID) | ORAL | Status: DC
  Filled 2014-02-15 (×3): qty 1

## 2014-02-15 MED ORDER — HYDROCODONE-ACETAMINOPHEN 7.5-325 MG PO TABS
1.0000 | ORAL_TABLET | Freq: Four times a day (QID) | ORAL | Status: DC
Start: 2014-02-15 — End: 2014-02-15
  Administered 2014-02-15: 1 via ORAL
  Filled 2014-02-15: qty 1

## 2014-02-15 MED ORDER — RIVAROXABAN 10 MG PO TABS
10.0000 mg | ORAL_TABLET | Freq: Every day | ORAL | Status: DC
  Administered 2014-02-16: 10 mg via ORAL
  Filled 2014-02-15 (×2): qty 1

## 2014-02-15 MED ORDER — POLYETHYLENE GLYCOL 3350 17 G PO PACK
17.0000 g | PACK | Freq: Every day | ORAL | Status: DC | PRN
Start: 2014-02-15 — End: 2014-02-16

## 2014-02-15 MED ORDER — BISACODYL 10 MG RE SUPP: 10.0000 mg | Freq: Every day | RECTAL | Status: DC | PRN

## 2014-02-15 MED ORDER — DEXAMETHASONE SODIUM PHOSPHATE 10 MG/ML IJ SOLN
INTRAMUSCULAR | Status: AC
  Filled 2014-02-15: qty 1

## 2014-02-15 MED ORDER — SODIUM CHLORIDE 0.9 % IJ SOLN
INTRAMUSCULAR | Status: DC | PRN
  Administered 2014-02-15: 9 mL via INTRAVENOUS

## 2014-02-15 MED ORDER — DIAZEPAM 2 MG PO TABS: 2.0000 mg | ORAL_TABLET | Freq: Every evening | ORAL | Status: DC | PRN

## 2014-02-15 MED ORDER — PROPOFOL INFUSION 10 MG/ML OPTIME
INTRAVENOUS | Status: DC | PRN
  Administered 2014-02-15: 100 ug/kg/min via INTRAVENOUS

## 2014-02-15 MED ORDER — KETOROLAC TROMETHAMINE 30 MG/ML IJ SOLN
INTRAMUSCULAR | Status: AC
  Filled 2014-02-15: qty 1

## 2014-02-15 MED ORDER — AMITRIPTYLINE HCL 50 MG PO TABS
50.0000 mg | ORAL_TABLET | Freq: Every day | ORAL | Status: DC
  Administered 2014-02-15: 25 mg via ORAL
  Filled 2014-02-15 (×2): qty 1

## 2014-02-15 MED ORDER — LIDOCAINE HCL (CARDIAC) 20 MG/ML IV SOLN
INTRAVENOUS | Status: AC
  Filled 2014-02-15: qty 5

## 2014-02-15 MED ORDER — METHOCARBAMOL 500 MG PO TABS
500.0000 mg | ORAL_TABLET | Freq: Four times a day (QID) | ORAL | Status: DC | PRN
  Administered 2014-02-15 – 2014-02-16 (×2): 500 mg via ORAL
  Filled 2014-02-15 (×2): qty 1

## 2014-02-15 MED ORDER — ONDANSETRON HCL 4 MG/2ML IJ SOLN
4.0000 mg | Freq: Four times a day (QID) | INTRAMUSCULAR | Status: DC | PRN
Start: 2014-02-15 — End: 2014-02-16

## 2014-02-15 MED ORDER — SODIUM CHLORIDE 0.9 % IV SOLN
INTRAVENOUS | Status: DC
  Administered 2014-02-15 – 2014-02-16 (×2): via INTRAVENOUS
  Filled 2014-02-15 (×7): qty 1000

## 2014-02-15 MED ORDER — MAGNESIUM CITRATE PO SOLN: 1.0000 | Freq: Once | ORAL | Status: AC | PRN

## 2014-02-15 MED ORDER — DEXAMETHASONE SODIUM PHOSPHATE 10 MG/ML IJ SOLN
10.0000 mg | Freq: Once | INTRAMUSCULAR | Status: AC
  Administered 2014-02-15: 10 mg via INTRAVENOUS

## 2014-02-15 MED ORDER — CLINDAMYCIN PHOSPHATE 600 MG/50ML IV SOLN
600.0000 mg | Freq: Four times a day (QID) | INTRAVENOUS | Status: AC
  Administered 2014-02-15 (×2): 600 mg via INTRAVENOUS
  Filled 2014-02-15 (×2): qty 50

## 2014-02-15 MED ORDER — METOCLOPRAMIDE HCL 5 MG/ML IJ SOLN: 5.0000 mg | Freq: Three times a day (TID) | INTRAMUSCULAR | Status: DC | PRN

## 2014-02-15 MED ORDER — ACETAMINOPHEN 325 MG PO TABS: 650.0000 mg | ORAL_TABLET | Freq: Four times a day (QID) | ORAL | Status: DC | PRN

## 2014-02-15 MED ORDER — DOCUSATE SODIUM 100 MG PO CAPS
100.0000 mg | ORAL_CAPSULE | Freq: Two times a day (BID) | ORAL | Status: DC
  Administered 2014-02-15 – 2014-02-16 (×2): 100 mg via ORAL

## 2014-02-15 MED ORDER — BUPIVACAINE IN DEXTROSE 0.75-8.25 % IT SOLN
INTRATHECAL | Status: DC | PRN
  Administered 2014-02-15: 1.8 mL via INTRATHECAL

## 2014-02-15 MED ORDER — CHLORHEXIDINE GLUCONATE 4 % EX LIQD: 60.0000 mL | Freq: Once | CUTANEOUS | Status: DC

## 2014-02-15 MED ORDER — CLINDAMYCIN PHOSPHATE 600 MG/50ML IV SOLN
600.0000 mg | Freq: Once | INTRAVENOUS | Status: AC
  Administered 2014-02-15: 600 mg via INTRAVENOUS
  Filled 2014-02-15: qty 50

## 2014-02-15 MED ORDER — TRANEXAMIC ACID 100 MG/ML IV SOLN
1000.0000 mg | Freq: Once | INTRAVENOUS | Status: AC
  Administered 2014-02-15: 1000 mg via INTRAVENOUS
  Filled 2014-02-15: qty 10

## 2014-02-15 MED ORDER — DEXTROSE 5 % IV SOLN
500.0000 mg | Freq: Four times a day (QID) | INTRAVENOUS | Status: DC | PRN
  Administered 2014-02-15: 500 mg via INTRAVENOUS
  Filled 2014-02-15: qty 5

## 2014-02-15 MED ORDER — BUPIVACAINE-EPINEPHRINE (PF) 0.25% -1:200000 IJ SOLN
INTRAMUSCULAR | Status: AC
  Filled 2014-02-15: qty 30

## 2014-02-15 MED ORDER — CLINDAMYCIN PHOSPHATE 900 MG/50ML IV SOLN
900.0000 mg | INTRAVENOUS | Status: AC
  Administered 2014-02-15: 600 mg via INTRAVENOUS

## 2014-02-15 MED ORDER — ACETAMINOPHEN 500 MG PO TABS
1000.0000 mg | ORAL_TABLET | Freq: Four times a day (QID) | ORAL | Status: DC
  Administered 2014-02-15 – 2014-02-16 (×3): 1000 mg via ORAL
  Filled 2014-02-15 (×4): qty 2

## 2014-02-15 MED ORDER — ONDANSETRON HCL 4 MG PO TABS: 4.0000 mg | ORAL_TABLET | Freq: Four times a day (QID) | ORAL | Status: DC | PRN

## 2014-02-15 MED ORDER — ONDANSETRON HCL 4 MG/2ML IJ SOLN
INTRAMUSCULAR | Status: DC | PRN
  Administered 2014-02-15: 4 mg via INTRAVENOUS

## 2014-02-15 MED ORDER — BUPIVACAINE-EPINEPHRINE (PF) 0.25% -1:200000 IJ SOLN
INTRAMUSCULAR | Status: DC | PRN
  Administered 2014-02-15: 30 mL

## 2014-02-15 MED ORDER — PHENOL 1.4 % MT LIQD
1.0000 | OROMUCOSAL | Status: DC | PRN
  Filled 2014-02-15: qty 177

## 2014-02-15 MED ORDER — PROPOFOL 10 MG/ML IV EMUL
INTRAVENOUS | Status: DC | PRN
  Administered 2014-02-15: 20 mg via INTRAVENOUS

## 2014-02-15 MED ORDER — HYDROCODONE-ACETAMINOPHEN 7.5-325 MG PO TABS
1.0000 | ORAL_TABLET | ORAL | Status: DC | PRN
  Administered 2014-02-15 – 2014-02-16 (×3): 1 via ORAL
  Filled 2014-02-15 (×3): qty 1

## 2014-02-15 MED ORDER — KETOROLAC TROMETHAMINE 30 MG/ML IJ SOLN
INTRAMUSCULAR | Status: DC | PRN
  Administered 2014-02-15: 30 mg via INTRAVENOUS

## 2014-02-15 MED ORDER — DEXAMETHASONE SODIUM PHOSPHATE 10 MG/ML IJ SOLN
10.0000 mg | Freq: Once | INTRAMUSCULAR | Status: AC
  Administered 2014-02-16: 10 mg via INTRAVENOUS
  Filled 2014-02-15: qty 1

## 2014-02-15 MED ORDER — ALUM & MAG HYDROXIDE-SIMETH 200-200-20 MG/5ML PO SUSP: 30.0000 mL | ORAL | Status: DC | PRN

## 2014-02-15 MED ORDER — CLINDAMYCIN PHOSPHATE 900 MG/50ML IV SOLN
INTRAVENOUS | Status: AC
  Filled 2014-02-15: qty 50

## 2014-02-15 SURGICAL SUPPLY — 54 items
ADH SKN CLS APL DERMABOND .7 (GAUZE/BANDAGES/DRESSINGS) ×1
BAG SPEC THK2 15X12 ZIP CLS (MISCELLANEOUS)
BAG ZIPLOCK 12X15 (MISCELLANEOUS) IMPLANT
BANDAGE ELASTIC 6 VELCRO ST LF (GAUZE/BANDAGES/DRESSINGS) ×2 IMPLANT
BANDAGE ESMARK 6X9 LF (GAUZE/BANDAGES/DRESSINGS) ×1 IMPLANT
BLADE SAW SGTL 13.0X1.19X90.0M (BLADE) ×2 IMPLANT
BNDG CMPR 9X6 STRL LF SNTH (GAUZE/BANDAGES/DRESSINGS) ×1
BNDG ESMARK 6X9 LF (GAUZE/BANDAGES/DRESSINGS) ×2
BOWL SMART MIX CTS (DISPOSABLE) ×2 IMPLANT
CATH FOLEY LATEX FREE 16FR (CATHETERS) ×1 IMPLANT
CEMENT HV SMART SET (Cement) ×1 IMPLANT
CUFF TOURN SGL QUICK 34 (TOURNIQUET CUFF) ×2
CUFF TRNQT CYL 34X4X40X1 (TOURNIQUET CUFF) ×1 IMPLANT
DERMABOND ADVANCED (GAUZE/BANDAGES/DRESSINGS) ×1
DERMABOND ADVANCED .7 DNX12 (GAUZE/BANDAGES/DRESSINGS) ×1 IMPLANT
DRAPE EXTREMITY TIBURON (DRAPES) ×2 IMPLANT
DRAPE POUCH INSTRU U-SHP 10X18 (DRAPES) ×2 IMPLANT
DRAPE U-SHAPE 47X51 STRL (DRAPES) ×2 IMPLANT
DRSG AQUACEL AG ADV 3.5X10 (GAUZE/BANDAGES/DRESSINGS) ×2 IMPLANT
DURAPREP 26ML APPLICATOR (WOUND CARE) ×4 IMPLANT
ELECT REM PT RETURN 9FT ADLT (ELECTROSURGICAL) ×2
ELECTRODE REM PT RTRN 9FT ADLT (ELECTROSURGICAL) ×1 IMPLANT
FACESHIELD WRAPAROUND (MASK) ×10 IMPLANT
FACESHIELD WRAPAROUND OR TEAM (MASK) ×5 IMPLANT
GLOVE BIOGEL PI IND STRL 7.5 (GLOVE) ×1 IMPLANT
GLOVE BIOGEL PI IND STRL 8.5 (GLOVE) ×1 IMPLANT
GLOVE BIOGEL PI INDICATOR 7.5 (GLOVE) ×1
GLOVE BIOGEL PI INDICATOR 8.5 (GLOVE) ×1
GLOVE ECLIPSE 8.0 STRL XLNG CF (GLOVE) ×2 IMPLANT
GLOVE ORTHO TXT STRL SZ7.5 (GLOVE) ×4 IMPLANT
GOWN SPEC L3 XXLG W/TWL (GOWN DISPOSABLE) ×2 IMPLANT
GOWN STRL REUS W/TWL LRG LVL3 (GOWN DISPOSABLE) ×2 IMPLANT
HANDPIECE INTERPULSE COAX TIP (DISPOSABLE) ×2
KIT BASIN OR (CUSTOM PROCEDURE TRAY) ×2 IMPLANT
KNEE JOURNEY II OXI VERI NP TI ×1 IMPLANT
MANIFOLD NEPTUNE II (INSTRUMENTS) ×2 IMPLANT
NDL SAFETY ECLIPSE 18X1.5 (NEEDLE) ×1 IMPLANT
NEEDLE HYPO 18GX1.5 SHARP (NEEDLE) ×2
PACK TOTAL JOINT (CUSTOM PROCEDURE TRAY) ×2 IMPLANT
POSITIONER SURGICAL ARM (MISCELLANEOUS) ×2 IMPLANT
SET HNDPC FAN SPRY TIP SCT (DISPOSABLE) ×1 IMPLANT
SET PAD KNEE POSITIONER (MISCELLANEOUS) ×2 IMPLANT
SUCTION FRAZIER 12FR DISP (SUCTIONS) ×2 IMPLANT
SUT MNCRL AB 4-0 PS2 18 (SUTURE) ×2 IMPLANT
SUT VIC AB 1 CT1 36 (SUTURE) ×2 IMPLANT
SUT VIC AB 2-0 CT1 27 (SUTURE) ×6
SUT VIC AB 2-0 CT1 TAPERPNT 27 (SUTURE) ×3 IMPLANT
SUT VLOC 180 0 24IN GS25 (SUTURE) ×2 IMPLANT
SYR 50ML LL SCALE MARK (SYRINGE) ×2 IMPLANT
TOWEL OR 17X26 10 PK STRL BLUE (TOWEL DISPOSABLE) ×2 IMPLANT
TOWEL OR NON WOVEN STRL DISP B (DISPOSABLE) IMPLANT
TRAY FOLEY CATH 14FRSI W/METER (CATHETERS) ×2 IMPLANT
WATER STERILE IRR 1500ML POUR (IV SOLUTION) ×2 IMPLANT
WRAP KNEE MAXI GEL POST OP (GAUZE/BANDAGES/DRESSINGS) ×2 IMPLANT

## 2014-02-15 NOTE — Anesthesia Postprocedure Evaluation (Signed)
  Anesthesia Post-op Note  Patient: Jodi Gutierrez  Procedure(s) Performed: Procedure(s) (LRB): RIGHT TOTAL KNEE ARTHROPLASTY (Right)  Patient Location: PACU  Anesthesia Type: spinal  Level of Consciousness: awake and alert   Airway and Oxygen Therapy: Patient Spontanous Breathing  Post-op Pain: mild  Post-op Assessment: Post-op Vital signs reviewed, Patient's Cardiovascular Status Stable, Respiratory Function Stable, Patent Airway and No signs of Nausea or vomiting  Last Vitals:  Filed Vitals:   02/15/14 1430  BP: 144/75  Pulse: 85  Temp: 36.8 C  Resp: 16    Post-op Vital Signs: stable   Complications: No apparent anesthesia complications

## 2014-02-15 NOTE — Transfer of Care (Signed)
Immediate Anesthesia Transfer of Care Note  Patient: Jodi Gutierrez  Procedure(s) Performed: Procedure(s): RIGHT TOTAL KNEE ARTHROPLASTY (Right)  Patient Location: PACU  Anesthesia Type:Spinal  Level of Consciousness: awake, alert  and oriented  Airway & Oxygen Therapy: Patient Spontanous Breathing and Patient connected to nasal cannula oxygen  Post-op Assessment: Report given to PACU RN and Post -op Vital signs reviewed and stable  Post vital signs: Reviewed and stable  Complications: No apparent anesthesia complications

## 2014-02-15 NOTE — Progress Notes (Signed)
[facility] is providing the following services: Patient declined rw and commode - has a rw at home already and she has a handicapped toilet.   If patient discharges after hours, please call [phone].   Linward Headland 02/15/2014, 2:40 PM

## 2014-02-15 NOTE — Interval H&P Note (Signed)
History and Physical Interval Note:  02/15/2014 7:38 AM  Jodi Gutierrez  has presented today for surgery, with the diagnosis of RIGHT KNEE OA   The various methods of treatment have been discussed with the patient and family. After consideration of risks, benefits and other options for treatment, the patient has consented to  Procedure(s): RIGHT TOTAL KNEE ARTHROPLASTY (Right) as a surgical intervention .  The patient's history has been reviewed, patient examined, no change in status, stable for surgery.  I have reviewed the patient's chart and labs.  Questions were answered to the patient's satisfaction.     Mauri Pole

## 2014-02-15 NOTE — Plan of Care (Signed)
Problem: Consults Goal: Diagnosis- Total Joint Replacement Primary Total Knee     

## 2014-02-15 NOTE — Evaluation (Signed)
Physical Therapy Evaluation Patient Details Name: MAELANI YARBRO MRN: [id number] DOB: 1950-06-23 Today's Date: 02/15/2014   History of Present Illness  63 yo female s/p R TKA 02/15/14.   Clinical Impression  On eval, pt required Min assist for mobility-able to ambulate ~75 feet with rolling walker. Tolerated activity well. Anticipate pt will progress well during stay.     Follow Up Recommendations Home health PT;Supervision for mobility/OOB    Equipment Recommendations  Rolling walker with 5" wheels    Recommendations for Other Services       Precautions / Restrictions Precautions Precautions: Fall;Knee Restrictions Weight Bearing Restrictions: No RLE Weight Bearing: Weight bearing as tolerated      Mobility  Bed Mobility Overal bed mobility: Needs Assistance Bed Mobility: Supine to Sit     Supine to sit: Min assist     General bed mobility comments: assist for R LE  Transfers Overall transfer level: Needs assistance Equipment used: Rolling walker (2 wheeled) Transfers: Sit to/from Stand Sit to Stand: Min guard         General transfer comment: close guard for safety. vcs safety, technique, hand placement  Ambulation/Gait Ambulation/Gait assistance: Min guard Ambulation Distance (Feet): 75 Feet Assistive device: Rolling walker (2 wheeled) Gait Pattern/deviations: Step-to pattern;Step-through pattern;Decreased stride length;Antalgic     General Gait Details: vcs safety, technique, sequence. pt able to begin reciprocal gait pattern midway through ambulation distance. tolerated well  Stairs            Wheelchair Mobility    Modified Rankin (Stroke Patients Only)       Balance                                             Pertinent Vitals/Pain Pain Assessment: 0-10 Pain Score: 4  Pain Location: R knee Pain Intervention(s): Limited activity within patient's tolerance;Monitored during session;Premedicated before session;Ice  applied    Home Living Family/patient expects to be discharged to:: Private residence Living Arrangements: Spouse/significant other Available Help at Discharge: Family Type of Home: House Home Access: Stairs to enter Entrance Stairs-Rails: None Technical brewer of Steps: 1+1 Home Layout: One level Home Equipment: Environmental consultant - 2 wheels;Crutches;Cane - single point      Prior Function Level of Independence: Independent               Hand Dominance        Extremity/Trunk Assessment   Upper Extremity Assessment: Overall WFL for tasks assessed           Lower Extremity Assessment: RLE deficits/detail RLE Deficits / Details: hip flex 3-/5, knee ext 3-/5, moves ankle well    Cervical / Trunk Assessment: Normal  Communication   Communication: No difficulties  Cognition Arousal/Alertness: Awake/alert Behavior During Therapy: WFL for tasks assessed/performed Overall Cognitive Status: Within Functional Limits for tasks assessed                      General Comments      Exercises        Assessment/Plan    PT Assessment Patient needs continued PT services  PT Diagnosis Difficulty walking;Acute pain   PT Problem List Decreased strength;Decreased range of motion;Decreased activity tolerance;Decreased balance;Decreased mobility;Pain;Decreased knowledge of use of DME  PT Treatment Interventions DME instruction;Gait training;Stair training;Functional mobility training;Therapeutic activities;Patient/family education;Balance training;Therapeutic exercise   PT Goals (Current goals can be found  in the Care Plan section) Acute Rehab PT Goals Patient Stated Goal: home tomorrow PT Goal Formulation: With patient/family Time For Goal Achievement: 02/22/14 Potential to Achieve Goals: Good    Frequency 7X/week   Barriers to discharge        Co-evaluation               End of Session Equipment Utilized During Treatment: Gait belt Activity Tolerance:  Patient tolerated treatment well Patient left: in chair;with call bell/phone within reach;with family/visitor present           Time: 1324-4010 PT Time Calculation (min): 17 min   Charges:   PT Evaluation $Initial PT Evaluation Tier I: 1 Procedure PT Treatments $Gait Training: 8-22 mins   PT G Codes:          Weston Anna, MPT Pager: [phone]

## 2014-02-15 NOTE — Progress Notes (Signed)
Utilization review completed.  

## 2014-02-15 NOTE — Brief Op Note (Signed)
02/15/2014  1:16 PM  PATIENT:  Jodi Gutierrez  63 y.o. female  PRE-OPERATIVE DIAGNOSIS:  RIGHT KNEE primary osteoarthritis   POST-OPERATIVE DIAGNOSIS:   RIGHT KNEE primary osteoarthritis  PROCEDURE:  Procedure(s): RIGHT TOTAL KNEE ARTHROPLASTY (Right)  SURGEON:  Surgeon(s) and Role:    * Mauri Pole, MD - Primary  PHYSICIAN ASSISTANT:  Nehemiah Massed, PA-C  ANESTHESIA:   spinal  EBL:  Total I/O In: 3159 [I.V.:1700; IV Piggyback:110] Out: 275 [Urine:225; Blood:50]  BLOOD ADMINISTERED:none  DRAINS: none   LOCAL MEDICATIONS USED:  Exparel/Marcaine combination  SPECIMEN:  No Specimen  DISPOSITION OF SPECIMEN:  N/A  COUNTS:  YES  TOURNIQUET:   Total Tourniquet Time Documented: Thigh (Right) - 62 minutes Total: Thigh (Right) - 62 minutes   DICTATION: .Other Dictation: Dictation Number [id number]  PLAN OF CARE: Admit to inpatient   PATIENT DISPOSITION:  PACU - hemodynamically stable.   Delay start of Pharmacological VTE agent (>24hrs) due to surgical blood loss or risk of bleeding: no

## 2014-02-15 NOTE — Anesthesia Procedure Notes (Addendum)
Spinal   Spinal  Patient location during procedure: OR Start time: 02/15/2014 9:15 AM End time: 02/15/2014 9:20 AM Staffing Anesthesiologist: Milana Obey Performed by: anesthesiologist and resident/CRNA  Preanesthetic Checklist Completed: patient identified, site marked, surgical consent, pre-op evaluation, timeout performed, IV checked, risks and benefits discussed and monitors and equipment checked Spinal Block Patient position: sitting Prep: Betadine Patient monitoring: continuous pulse ox, blood pressure and heart rate Approach: midline Location: L3-4 Injection technique: single-shot Needle Needle type: Spinocan  Needle gauge: 22 G Needle length: 9 cm Assessment Sensory level: T8 Additional Notes Functioning IV was confirmed and monitors were applied. Sterile prep and drape, including hand hygiene and sterile gloves were used. The patient was positioned and the spine was prepped. The skin was anesthetized with lidocaine.  Free flow of clear CSF was obtained prior to injecting local anesthetic into the CSF.  The spinal needle aspirated freely following injection.  The needle was carefully withdrawn.  The patient tolerated the procedure well.  Lauretta Grill, MD

## 2014-02-16 ENCOUNTER — Encounter (HOSPITAL_COMMUNITY): Payer: Self-pay | Admitting: Orthopedic Surgery

## 2014-02-16 DIAGNOSIS — Z96659 Presence of unspecified artificial knee joint: Secondary | ICD-10-CM

## 2014-02-16 LAB — CBC
HCT: 33.8 % — ABNORMAL LOW (ref 36.0–46.0)
Hemoglobin: 11.3 g/dL — ABNORMAL LOW (ref 12.0–15.0)
MCH: 30.9 pg (ref 26.0–34.0)
MCHC: 33.4 g/dL (ref 30.0–36.0)
MCV: 92.3 fL (ref 78.0–100.0)
PLATELETS: 224 10*3/uL (ref 150–400)
RBC: 3.66 MIL/uL — AB (ref 3.87–5.11)
RDW: 12.2 % (ref 11.5–15.5)
WBC: 11.4 10*3/uL — ABNORMAL HIGH (ref 4.0–10.5)

## 2014-02-16 LAB — BASIC METABOLIC PANEL
ANION GAP: 10 (ref 5–15)
BUN: 10 mg/dL (ref 6–23)
CALCIUM: 8.4 mg/dL (ref 8.4–10.5)
CO2: 24 meq/L (ref 19–32)
Chloride: 105 mEq/L (ref 96–112)
Creatinine, Ser: 0.66 mg/dL (ref 0.50–1.10)
Glucose, Bld: 139 mg/dL — ABNORMAL HIGH (ref 70–99)
Potassium: 4.5 mEq/L (ref 3.7–5.3)
SODIUM: 139 meq/L (ref 137–147)

## 2014-02-16 MED ORDER — POLYETHYLENE GLYCOL 3350 17 G PO PACK: 17.0000 g | PACK | Freq: Every day | ORAL | Status: DC | PRN

## 2014-02-16 MED ORDER — RIVAROXABAN 10 MG PO TABS: 10.0000 mg | ORAL_TABLET | Freq: Every day | ORAL | Status: DC

## 2014-02-16 MED ORDER — METHOCARBAMOL 500 MG PO TABS: 500.0000 mg | ORAL_TABLET | Freq: Four times a day (QID) | ORAL | Status: DC | PRN

## 2014-02-16 MED ORDER — DSS 100 MG PO CAPS: 100.0000 mg | ORAL_CAPSULE | Freq: Two times a day (BID) | ORAL | Status: DC

## 2014-02-16 MED ORDER — HYDROCODONE-ACETAMINOPHEN 7.5-325 MG PO TABS: 1.0000 | ORAL_TABLET | ORAL | Status: DC | PRN

## 2014-02-16 NOTE — Progress Notes (Signed)
     Subjective: 1 Day Post-Op Procedure(s) (LRB): RIGHT TOTAL KNEE ARTHROPLASTY (Right)   Patient reports pain as mild, pain really well controlled. No events throughout the night. Working really well with PT. Ready to be discharged home.  Objective:   VITALS:   Filed Vitals:   02/16/14 0528  BP: 130/65  Pulse: 100  Temp: 97.8 F (36.6 C)  Resp: 16    Dorsiflexion/Plantar flexion intact Incision: dressing C/D/I No cellulitis present Compartment soft  LABS  Recent Labs  02/16/14 0444  HGB 11.3*  HCT 33.8*  WBC 11.4*  PLT 224     Recent Labs  02/16/14 0444  NA 139  K 4.5  BUN 10  CREATININE 0.66  GLUCOSE 139*     Assessment/Plan: 1 Day Post-Op Procedure(s) (LRB): RIGHT TOTAL KNEE ARTHROPLASTY (Right) Foley cath d/c'ed Advance diet Up with therapy D/C IV fluids Discharge home with home health Follow up in 2 weeks at [facility]. Follow up with OLIN,Delyle Weider D in 2 weeks.  Contact information:  [facility] [address] [place] Laurren Lepkowski   PAC  02/16/2014, 9:58 AM

## 2014-02-16 NOTE — Evaluation (Signed)
Occupational Therapy Evaluation Patient Details Name: Jodi Gutierrez MRN: [id number] DOB: 02-03-1951 Today's Date: Feb 21, 2014    History of Present Illness 63 yo female s/p R TKA 02/15/14.    Clinical Impression   Education complete regarding ADL activity s.p TKR    Follow Up Recommendations  No OT follow up    Equipment Recommendations  None recommended by OT    Recommendations for Other Services       Precautions / Restrictions Restrictions Weight Bearing Restrictions: No RLE Weight Bearing: Weight bearing as tolerated      Mobility Bed Mobility Overal bed mobility: Needs Assistance Bed Mobility: Supine to Sit     Supine to sit: Supervision        Transfers Overall transfer level: Needs assistance Equipment used: Rolling walker (2 wheeled) Transfers: Sit to/from Stand Sit to Stand: Supervision                   ADL Overall ADL's : Needs assistance/impaired                     Lower Body Dressing: Supervision/safety;Sit to/from stand   Toilet Transfer: Supervision/safety;RW   Toileting- Water quality scientist and Hygiene: Supervision/safety;Sit to/from stand       Functional mobility during ADLs: Supervision/safety General ADL Comments: pt overall S with ADL activity . Husband will A as needed.  Pt doing great and is very motivated!            Hand Dominance     Extremity/Trunk Assessment Upper Extremity Assessment Upper Extremity Assessment: Overall WFL for tasks assessed           Communication Communication Communication: No difficulties   Cognition Arousal/Alertness: Awake/alert Behavior During Therapy: WFL for tasks assessed/performed Overall Cognitive Status: Within Functional Limits for tasks assessed                     General Comments               Home Living Family/patient expects to be discharged to:: Private residence Living Arrangements: Spouse/significant other Available Help at Discharge:  Family Type of Home: House Home Access: Stairs to enter Technical brewer of Steps: 1+1 Entrance Stairs-Rails: None Home Layout: One level     Bathroom Shower/Tub: Teacher, early years/pre: Standard     Home Equipment: Environmental consultant - 2 wheels;Crutches;Cane - single point          Prior Functioning/Environment Level of Independence: Independent                      OT Goals(Current goals can be found in the care plan section) Acute Rehab OT Goals Patient Stated Goal: home tomorrow OT Goal Formulation: With patient  OT Frequency:      End of Session Nurse Communication: Mobility status  Activity Tolerance: Patient tolerated treatment well Patient left: in chair;with call bell/phone within reach   Time: 0900-0925 OT Time Calculation (min): 25 min Charges:  OT General Charges $OT Visit: 1 Procedure OT Evaluation $Initial OT Evaluation Tier I: 1 Procedure OT Treatments $Self Care/Home Management : 8-22 mins G-Codes:    Jodi Gutierrez D 2014-02-21, 9:32 AM

## 2014-02-16 NOTE — Progress Notes (Signed)
Physical Therapy Treatment Patient Details Name: Jodi Gutierrez MRN: [id number] DOB: 20-Mar-1951 Today's Date: 02/16/2014    History of Present Illness 63 yo female s/p R TKA 02/15/14.     PT Comments    Pt continuing to perform well. Practiced exercises, ambulation, and stair negotiation. Reviewed car transfer. Ready to d/c home from PT standpoint. RN made aware.   Follow Up Recommendations  Home health PT     Equipment Recommendations  Rolling walker with 5" wheels    Recommendations for Other Services       Precautions / Restrictions Precautions Precautions: Knee;Fall Restrictions Weight Bearing Restrictions: No RLE Weight Bearing: Weight bearing as tolerated    Mobility  Bed Mobility Overal bed mobility: Needs Assistance Bed Mobility: Supine to Sit     Supine to sit: Supervision     General bed mobility comments: pt sitting in recliner  Transfers Overall transfer level: Needs assistance Equipment used: Rolling walker (2 wheeled) Transfers: Sit to/from Stand Sit to Stand: Supervision         General transfer comment: vcs safety, technique, hand placement  Ambulation/Gait Ambulation/Gait assistance: Supervision Ambulation Distance (Feet): 200 Feet Assistive device: Rolling walker (2 wheeled) Gait Pattern/deviations: Step-through pattern;Decreased stride length;Antalgic     General Gait Details: vcs safety, technique, sequence.distance tolerated well   Stairs Stairs: Yes Stairs assistance: Min guard Stair Management: Step to pattern;Forwards;With walker Number of Stairs: 1 (x2) General stair comments: VCs safety, technique, sequence. Practiced forwards and backwards.   Wheelchair Mobility    Modified Rankin (Stroke Patients Only)       Balance                                    Cognition Arousal/Alertness: Awake/alert Behavior During Therapy: WFL for tasks assessed/performed Overall Cognitive Status: Within Functional Limits  for tasks assessed                      Exercises Total Joint Exercises Ankle Circles/Pumps: AROM;Both;15 reps;Seated Quad Sets: AROM;Both;15 reps;Seated Hip ABduction/ADduction: AROM;10 reps;Seated;Both Straight Leg Raises: AROM;10 reps;Seated;Both Knee Flexion: Seated;10 reps Goniometric ROM: 0-95 degrees    General Comments        Pertinent Vitals/Pain Pain Assessment: 0-10 Pain Score: 3  Pain Location: R knee Pain Descriptors / Indicators: Aching Pain Intervention(s): Ice applied;Repositioned;Monitored during session    Home Living Family/patient expects to be discharged to:: Private residence Living Arrangements: Spouse/significant other Available Help at Discharge: Family Type of Home: House Home Access: Stairs to enter Entrance Stairs-Rails: None Home Layout: One level Home Equipment: Environmental consultant - 2 wheels;Crutches;Cane - single point      Prior Function Level of Independence: Independent          PT Goals (current goals can now be found in the care plan section) Acute Rehab PT Goals Patient Stated Goal: home tomorrow Progress towards PT goals: Progressing toward goals    Frequency  7X/week    PT Plan Current plan remains appropriate    Co-evaluation             End of Session Equipment Utilized During Treatment: Gait belt Activity Tolerance: Patient tolerated treatment well Patient left: in chair;with call bell/phone within reach     Time: 0939-1004 PT Time Calculation (min): 25 min  Charges:  $Gait Training: 8-22 mins $Therapeutic Exercise: 8-22 mins  G Codes:      Weston Anna, MPT Pager: [phone]

## 2014-02-16 NOTE — Op Note (Signed)
NAMEJENIKA, Jodi Gutierrez                 ACCOUNT NO.:  [id number]  MEDICAL RECORD NO.:  [id number]  LOCATION:  103                         FACILITY:  [facility]  PHYSICIAN:  Pietro Cassis. Alvan Dame, M.D.  DATE OF BIRTH:  [birth year]  DATE OF PROCEDURE:  02/15/2014 DATE OF DISCHARGE:                              OPERATIVE REPORT   PREOPERATIVE DIAGNOSIS:  Right knee osteoarthritis.  POSTOPERATIVE DIAGNOSIS:  Right knee osteoarthritis.  PROCEDURE:  Right total knee arthroplasty.  COMPONENTS USED:  A Smith Nephew Journey Bi-Cruciate stabilized knee system with a size 5 right Oxinium femoral component, a size 3 Journey nonporous tibial base plate, and a size 3, 4 x 13 mm Journey II BCS XLPE insert and a 35 patellar dome.  SURGEON:  Pietro Cassis. Alvan Dame, M.D.  ASSISTANT:  Nehemiah Massed, PA-C.  ANESTHESIA:  Spinal.  SPECIMENS:  None.  COMPLICATIONS:  None.  TOURNIQUET TIME:  60 minutes at 250 mmHg.  INDICATIONS FOR PROCEDURE:  [name] is a pleasant 63 year old female who has been a patient over the past year or 2.  She has undergone arthroscopic surgery with persistent recurring problems limiting her overall functional quality of life.  She has then subsequently failed further treatment including injections, medications, cortisone, and viscosupplementation.  Based on the persistence and recurrence of her symptoms, she at this point is ready to discuss more definitive measures.  Risks, benefits, and necessity of further treatment options including knee arthroplasty reviewed and discussed.  Specific risks of infection, DVT, component failure, need for future surgery reviewed, the postoperative course and expectation reviewed in the setting of a total knee arthroplasty as opposed to knee arthroscopy.  Consent was obtained for benefit of pain relief.  PROCEDURE IN DETAIL:  The patient was brought to operative theater. Once adequate anesthesia, preoperative antibiotics administered, she  was positioned supine with the right thigh tourniquet placed.  The right lower extremity was then prepped and draped in sterile fashion with the right leg in the [facility] leg holder.  The time-out was performed identifying the patient, planned procedure, and extremity.  The right lower extremity was exsanguinated, tourniquet elevated to 250 mmHg.  A midline incision was made followed by soft tissue exposure.  Medial parapatellar arthrotomy was then made following exposure.  Following exposure of the knee, attention was first directed to patella.  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  Precut measurement was measured to be about 22 mm.  I resected down to about 14 mm and used a 38 patellar button to restore height and cover the surface with 35 patellar dome.  Lug holes were drilled and a button placed to protect the cut surface from retractors and saw blades.  At this point, attention was directed to the femur.  The femoral canal was opened with a drill, irrigated to try to prevent fat emboli.  The intramedullary rod was passed and 5 degrees valgus to the right knee. Mid standard resection off the distal femur of 9.5 mm.  At this point, following the distal femoral resection, we attended to the proximal tibial cut.  Using extramedullary guide, centered over this  tibial spine and parallel to it, we made a cut measured 9 mm off the lateral high side of the tibia.  Following this resection, we confirmed that at least a 10 spacer block would fit in the gap.  Once this was done, also confirmed that the cut was perpendicular in the coronal plane using alignment rod.  At this point, I sized the femur with anterior reference to be a size 5. The size 5 block was then pinned into position and the 5 cuts for this system were then made.  The trial component was then placed, based at the lateral aspect of  distal femur and the preparation of the box carried out.  Following this, the tibia subluxated anteriorly and based on the cut surface, I selected a size 3 tibial tray.  It was pinned into position through the medial third of the tubercle due to the fixed bearing nature of this prosthesis.  I drilled and impacted the keel portion.  A trial reduction was now carried out with 5 femur and placed a 3 tibial tray. We went from an 11 to a 13-mm insert where I felt they had excellent balance from extension to flexion with the patella tracking through the trochlea without application of pressure.  Given these findings, the trial component was removed.  Final components were opened.  The cement was then mixed.  While this was being prepared on the back table, we irrigated the knee with normal saline solution with pulse lavage.  I injected the capsular periosteal junction with 0.25% Marcaine with epinephrine and 20 mL of Exparel.  The final implants were then cemented onto clean and dried surfaces. The knee was brought to extension with a 13-mm trial insert in place. Extruded cement was removed.  Once the cement had fully cured, excessive cement was removed.  The final 13 insert was then impacted into its appropriate place.  We re-irrigated the knee at the end the case.  The tourniquet had been let down.  No Hemovac was required.  At this point, the extensor mechanism was reapproximated with the knee in flexion using #1 Vicryl and 0 V-Loc sutures.  The remaining of the wound was closed with 2-0 Vicryl and running 4-0 Monocryl.  The knee was cleaned, dried, and dressed sterilely using Dermabond and a Mepilex dressing due to an Aquacel or silver-based allergy.     Pietro Cassis Alvan Dame, M.D.     MDO/MEDQ  D:  02/15/2014  T:  02/16/2014  Job:  [id number]

## 2014-02-17 NOTE — Progress Notes (Signed)
CARE MANAGEMENT NOTE 02/17/2014  Patient:  Jodi Gutierrez, Jodi Gutierrez   Account Number:  [id number]  Date Initiated:  02/17/2014  Documentation initiated by:  DAVIS,RHONDA  Subjective/Objective Assessment:   RIGHT TOTAL KNEE ARTHROPLASTY (Right)     Action/Plan:   home   Anticipated DC Date:  02/16/2014   Anticipated DC Plan:  [place] referral  NA      DC Planning Services  CM consult      [facility] Choice  NA   Choice offered to / List presented to:  C-1 Patient   DME arranged  NA      DME agency  NA     [place] arranged  HH-2 PT      [place]   Status of service:  Completed, signed off Medicare Important Message given?   (If response is "NO", the following Medicare IM given date fields will be blank) Date Medicare IM given:   Medicare IM given by:   Date Additional Medicare IM given:   Additional Medicare IM given by:    Discharge Disposition:  [place]  Per UR Regulation:  Reviewed for med. necessity/level of care/duration of stay  If discussed at [place] of Stay Meetings, dates discussed:    Comments:  Rhonda Davis,RN,BSN,CCM:

## 2014-02-28 NOTE — Discharge Summary (Signed)
Physician Discharge Summary  Patient ID: Jodi Gutierrez MRN: [id number] DOB/AGE: 08-04-1950 63 y.o.  Admit date: 02/15/2014 Discharge date: 02/16/2014   Procedures:  Procedure(s) (LRB): RIGHT TOTAL KNEE ARTHROPLASTY (Right)  Attending Physician:  Dr. Paralee Cancel   Admission Diagnoses:   Right knee OA / pain  Discharge Diagnoses:  Principal Problem:   S/P right TKA  Past Medical History  Diagnosis Date  . H/O fibromyalgia   . Claustrophobia   . Fibromyalgia   . Headache(784.0)   . Anemia     hx of as child and early adult     HPI: Jodi Gutierrez, 63 y.o. female, has a history of pain and functional disability in the right knee due to arthritis and has failed non-surgical conservative treatments for greater than 12 weeks to include NSAID's and/or analgesics, corticosteriod injections, viscosupplementation injections, supervised PT with diminished ADL's post treatment and activity modification. Onset of symptoms was gradual, starting years ago with gradually worsening course since that time. The patient noted prior procedures on the knee to include arthroscopy and menisectomy on the right knee(s). Patient currently rates pain in the right knee(s) at 9 out of 10 with activity. Patient has night pain, worsening of pain with activity and weight bearing, pain that interferes with activities of daily living, pain with passive range of motion, crepitus and joint swelling. Patient has evidence of periarticular osteophytes and joint space narrowing by imaging studies. There is no active infection. Risks, benefits and expectations were discussed with the patient. Risks including but not limited to the risk of anesthesia, blood clots, nerve damage, blood vessel damage, failure of the prosthesis, infection and up to and including death. Patient understand the risks, benefits and expectations and wishes to proceed with surgery.  PCP: Thressa Sheller, MD   Discharged Condition: good  Hospital  Course:  Patient underwent the above stated procedure on 02/15/2014. Patient tolerated the procedure well and brought to the recovery room in good condition and subsequently to the floor.  POD #1 BP: 130/65 ; Pulse: 100 ; Temp: 97.8 F (36.6 C) ; Resp: 16 Patient reports pain as mild, pain really well controlled. No events throughout the night. Working really well with PT. Ready to be discharged home. Dorsiflexion/plantar flexion intact, incision: dressing C/D/I, no cellulitis present and compartment soft.   LABS  Basename    HGB  11.3  HCT  33.8    Discharge Exam: General appearance: alert, cooperative and no distress Extremities: Homans sign is negative, no sign of DVT, no edema, redness or tenderness in the calves or thighs and no ulcers, gangrene or trophic changes  Disposition: Home with follow up in 2 weeks   Follow-up Information   Follow up with Mauri Pole, MD. Schedule an appointment as soon as possible for a visit in 2 weeks.   Specialty:  Orthopedic Surgery   Contact information:   [address] [place] [address] [phone]       Follow up with [facility].   Contact information:   [place]  [place] [address] [phone]       Discharge Instructions   Call MD / Call 911    Complete by:  As directed   If you experience chest pain or shortness of breath, CALL 911 and be transported to the hospital emergency room.  If you develope a fever above 101 F, pus (white drainage) or increased drainage or redness at the wound, or calf pain, call your  surgeon's office.     Change dressing    Complete by:  As directed   Maintain surgical dressing for 10-14 days, or until follow up in the clinic.     Constipation Prevention    Complete by:  As directed   Drink plenty of fluids.  Prune juice may be helpful.  You may use a stool softener, such as Colace (over the counter) 100 mg twice a day.  Use MiraLax (over the counter)  for constipation as needed.     Diet - low sodium heart healthy    Complete by:  As directed      Discharge instructions    Complete by:  As directed   Maintain surgical dressing for 10-14 days, or until follow up in the clinic. Follow up in 2 weeks at [facility]. Call with any questions or concerns.     Driving restrictions    Complete by:  As directed   No driving for 4 weeks     Increase activity slowly as tolerated    Complete by:  As directed      TED hose    Complete by:  As directed   Use stockings (TED hose) for 2 weeks on both leg(s).  You may remove them at night for sleeping.     Weight bearing as tolerated    Complete by:  As directed   Laterality:  right  Extremity:  Lower             Medication List    STOP taking these medications       ibuprofen 200 MG tablet  Commonly known as:  ADVIL,MOTRIN     traMADol 50 MG tablet  Commonly known as:  ULTRAM      TAKE these medications       amitriptyline 50 MG tablet  Commonly known as:  ELAVIL  Take 50 mg by mouth at bedtime.     calcium carbonate 500 MG chewable tablet  Commonly known as:  TUMS - dosed in mg elemental calcium  Chew 3 tablets by mouth daily. Patient takes 3 tablets of 500mg  of Calcium po daily.     cholecalciferol 1000 UNITS tablet  Commonly known as:  VITAMIN D  Take 1,000 Units by mouth 2 (two) times daily.     diazepam 2 MG tablet  Commonly known as:  VALIUM  Take 2 mg by mouth at bedtime as needed for sedation.     DSS 100 MG Caps  Take 100 mg by mouth 2 (two) times daily.     estradiol 0.5 MG tablet  Commonly known as:  ESTRACE  Take 0.5 mg by mouth at bedtime.     HYDROcodone-acetaminophen 7.5-325 MG per tablet  Commonly known as:  NORCO  Take 1-2 tablets by mouth every 4 (four) hours as needed for moderate pain.     methocarbamol 500 MG tablet  Commonly known as:  ROBAXIN  Take 1 tablet (500 mg total) by mouth every 6 (six) hours as needed for muscle spasms.       multivitamin tablet  Take 1 tablet by mouth every morning.     polyethylene glycol packet  Commonly known as:  MIRALAX / GLYCOLAX  Take 17 g by mouth daily as needed for mild constipation.     psyllium 58.6 % powder  Commonly known as:  METAMUCIL  Take 1 packet by mouth daily.     rivaroxaban 10 MG Tabs tablet  Commonly known as:  XARELTO  Take 1 tablet (10 mg total) by mouth daily with breakfast.         Signed: [place]. Cherylynn Liszewski   PA-C  02/28/2014, 10:17 AM

## 2016-03-08 ENCOUNTER — Encounter (HOSPITAL_COMMUNITY): Payer: Self-pay

## 2016-03-12 NOTE — Patient Instructions (Addendum)
Jodi Gutierrez  03/12/2016   Your procedure is scheduled on: 03/25/2016    Report to [facility] Main  Entrance take [place]  elevators to 3rd floor to  [place] at    [place] AM.  Call this number if you have problems the morning of surgery [phone]   Remember: ONLY 1 PERSON MAY GO WITH YOU TO SHORT STAY TO GET  READY MORNING OF [place].  Do not eat food or drink liquids :After Midnight.     Take these medicines the morning of surgery with A SIP OF WATER: none                                 You may not have any metal on your body including hair pins and              piercings  Do not wear jewelry, make-up, lotions, powders or perfumes, deodorant             Do not wear nail polish.  Do not shave  48 hours prior to surgery.             Do not bring valuables to the hospital. [place].  Contacts, dentures or bridgework may not be worn into surgery.  Leave suitcase in the car. After surgery it may be brought to your room.        Special Instructions: N/A              Please read over the following fact sheets you were given: _____________________________________________________________________             Your procedure is scheduled on: Report to [facility] Admitting at: Call this number if you have problems morning of your procedure:[phone]  Follow all bowel prep instructions per your doctor's orders.  Do not eat or drink anything after midnight the night before your procedure. You may brush your teeth, rinse out your mouth, but no water, no food, no chewing gum, no mints, no candies, no chewing tobacco.     Take these medicines the morning of your procedure with A SIP OF WATER:   Please make arrangements for a responsible person to drive you home after the procedure. You cannot go home by cab/taxi. We recommend you have someone with you at home the first 24 hours after your  procedure. Driver for procedure is  LEAVE ALL VALUABLES, JEWELRY, [place].  NO DENTURES, CONTACT LENSES ALLOWED IN THE ENDOSCOPY ROOM.   YOU MAY WEAR DEODORANT, PLEASE REMOVE ALL JEWELRY, WATCHES RINGS, BODY PIERCINGS AND LEAVE AT HOME.   WOMEN: NO MAKE-UP, LOTIONS PERFUMES WHAT IS A BLOOD TRANSFUSION? Blood Transfusion Information  A transfusion is the replacement of blood or some of its parts. Blood is made up of multiple cells which provide different functions.  Red blood cells carry oxygen and are used for blood loss replacement.  White blood cells fight against infection.  Platelets control bleeding.  Plasma helps clot blood.  Other blood products are available for specialized needs, such as hemophilia or other clotting disorders. BEFORE THE TRANSFUSION  Who gives blood for transfusions?   Healthy volunteers who are fully evaluated to make sure their blood is safe.  This is blood bank blood. Transfusion therapy is the safest it has ever been in the practice of medicine. Before blood is taken from a donor, a complete history is taken to make sure that person has no history of diseases nor engages in risky social behavior (examples are intravenous drug use or sexual activity with multiple partners). The donor's travel history is screened to minimize risk of transmitting infections, such as malaria. The donated blood is tested for signs of infectious diseases, such as HIV and hepatitis. The blood is then tested to be sure it is compatible with you in order to minimize the chance of a transfusion reaction. If you or a relative donates blood, this is often done in anticipation of surgery and is not appropriate for emergency situations. It takes many days to process the donated blood. RISKS AND COMPLICATIONS Although transfusion therapy is very safe and saves many lives, the main dangers of transfusion include:   Getting an infectious disease.  Developing a transfusion  reaction. This is an allergic reaction to something in the blood you were given. Every precaution is taken to prevent this. The decision to have a blood transfusion has been considered carefully by your caregiver before blood is given. Blood is not given unless the benefits outweigh the risks. AFTER THE TRANSFUSION  Right after receiving a blood transfusion, you will usually feel much better and more energetic. This is especially true if your red blood cells have gotten low (anemic). The transfusion raises the level of the red blood cells which carry oxygen, and this usually causes an energy increase.  The nurse administering the transfusion will monitor you carefully for complications. HOME CARE INSTRUCTIONS  No special instructions are needed after a transfusion. You may find your energy is better. Speak with your caregiver about any limitations on activity for underlying diseases you may have. SEEK MEDICAL CARE IF:   Your condition is not improving after your transfusion.  You develop redness or irritation at the intravenous (IV) site. SEEK IMMEDIATE MEDICAL CARE IF:  Any of the following symptoms occur over the next 12 hours:  Shaking chills.  You have a temperature by mouth above 102 F (38.9 C), not controlled by medicine.  Chest, back, or muscle pain.  People around you feel you are not acting correctly or are confused.  Shortness of breath or difficulty breathing.  Dizziness and fainting.  You get a rash or develop hives.  You have a decrease in urine output.  Your urine turns a dark color or changes to pink, red, or brown. Any of the following symptoms occur over the next 10 days:  You have a temperature by mouth above 102 F (38.9 C), not controlled by medicine.  Shortness of breath.  Weakness after normal activity.  The white part of the eye turns yellow (jaundice).  You have a decrease in the amount of urine or are urinating less often.  Your urine turns a  dark color or changes to pink, red, or brown. Document Released: 05/10/2000 Document Revised: 08/05/2011 Document Reviewed: 12/28/2007 ExitCare Patient Information 2014 [place].  _______________________________________________________________________  Incentive Spirometer  An incentive spirometer is a tool that can help keep your lungs clear and active. This tool measures how well you are filling your lungs with each breath. Taking long deep breaths may help reverse or decrease the chance of developing breathing (pulmonary) problems (especially infection) following:  A long period of time when you are unable to move or be active. BEFORE  THE PROCEDURE   If the spirometer includes an indicator to show your best effort, your nurse or respiratory therapist will set it to a desired goal.  If possible, sit up straight or lean slightly forward. Try not to slouch.  Hold the incentive spirometer in an upright position. INSTRUCTIONS FOR USE  1. Sit on the edge of your bed if possible, or sit up as far as you can in bed or on a chair. 2. Hold the incentive spirometer in an upright position. 3. Breathe out normally. 4. Place the mouthpiece in your mouth and seal your lips tightly around it. 5. Breathe in slowly and as deeply as possible, raising the piston or the ball toward the top of the column. 6. Hold your breath for 3-5 seconds or for as long as possible. Allow the piston or ball to fall to the bottom of the column. 7. Remove the mouthpiece from your mouth and breathe out normally. 8. Rest for a few seconds and repeat Steps 1 through 7 at least 10 times every 1-2 hours when you are awake. Take your time and take a few normal breaths between deep breaths. 9. The spirometer may include an indicator to show your best effort. Use the indicator as a goal to work toward during each repetition. 10. After each set of 10 deep breaths, practice coughing to be sure your lungs are clear. If you have  an incision (the cut made at the time of surgery), support your incision when coughing by placing a pillow or rolled up towels firmly against it. Once you are able to get out of bed, walk around indoors and cough well. You may stop using the incentive spirometer when instructed by your caregiver.  RISKS AND COMPLICATIONS  Take your time so you do not get dizzy or light-headed.  If you are in pain, you may need to take or ask for pain medication before doing incentive spirometry. It is harder to take a deep breath if you are having pain. AFTER USE  Rest and breathe slowly and easily.  It can be helpful to keep track of a log of your progress. Your caregiver can provide you with a simple table to help with this. If you are using the spirometer at home, follow these instructions: [place] IF:   You are having difficultly using the spirometer.  You have trouble using the spirometer as often as instructed.  Your pain medication is not giving enough relief while using the spirometer.  You develop fever of 100.5 F (38.1 C) or higher. SEEK IMMEDIATE MEDICAL CARE IF:   You cough up bloody sputum that had not been present before.  You develop fever of 102 F (38.9 C) or greater.  You develop worsening pain at or near the incision site. MAKE SURE YOU:   Understand these instructions.  Will watch your condition.  Will get help right away if you are not doing well or get worse. Document Released: 09/23/2006 Document Revised: 08/05/2011 Document Reviewed: 11/24/2006 ExitCare Patient Information 2014 [place].   ________________________________________________________________________ [facility] - Preparing for Surgery Before surgery, you can play an important role.  Because skin is not sterile, your skin needs to be as free of germs as possible.  You can reduce the number of germs on your skin by washing with CHG (chlorahexidine gluconate) soap before surgery.  CHG is  an antiseptic cleaner which kills germs and bonds with the skin to continue killing germs even after washing. Please  DO NOT use if you have an allergy to CHG or antibacterial soaps.  If your skin becomes reddened/irritated stop using the CHG and inform your nurse when you arrive at Short Stay. Do not shave (including legs and underarms) for at least 48 hours prior to the first CHG shower.  You may shave your face/neck. Please follow these instructions carefully:  1.  Shower with CHG Soap the night before surgery and the  morning of Surgery.  2.  If you choose to wash your hair, wash your hair first as usual with your  normal  shampoo.  3.  After you shampoo, rinse your hair and body thoroughly to remove the  shampoo.                           4.  Use CHG as you would any other liquid soap.  You can apply chg directly  to the skin and wash                       Gently with a scrungie or clean washcloth.  5.  Apply the CHG Soap to your body ONLY FROM THE NECK DOWN.   Do not use on face/ open                           Wound or open sores. Avoid contact with eyes, ears mouth and genitals (private parts).                       Wash face,  Genitals (private parts) with your normal soap.             6.  Wash thoroughly, paying special attention to the area where your surgery  will be performed.  7.  Thoroughly rinse your body with warm water from the neck down.  8.  DO NOT shower/wash with your normal soap after using and rinsing off  the CHG Soap.                9.  Pat yourself dry with a clean towel.            10.  Wear clean pajamas.            11.  Place clean sheets on your bed the night of your first shower and do not  sleep with pets. Day of Surgery : Do not apply any lotions/deodorants the morning of surgery.  Please wear clean clothes to the hospital/surgery center.  FAILURE TO FOLLOW THESE INSTRUCTIONS MAY RESULT IN THE CANCELLATION OF YOUR SURGERY PATIENT  SIGNATURE_________________________________  NURSE SIGNATURE__________________________________  ________________________________________________________________________

## 2016-03-13 NOTE — H&P (Signed)
TOTAL KNEE ADMISSION H&P  Patient is being admitted for left total knee arthroplasty .  Subjective:  Chief Complaint:   Left knee primary OA / pain  HPI: Lowry Ram, 65 y.o. female, has a history of pain and functional disability in the left knee due to arthritis and has failed non-surgical conservative treatments for greater than 12 weeks to include NSAID's and/or analgesics, corticosteriod injections and activity modification.  Onset of symptoms was gradual, starting years ago with gradually worsening course since that time. The patient noted prior procedures on the knee to include  arthroscopy on the left knee(s).  Patient currently rates pain in the left knee(s) at 8 out of 10 with activity. Patient has night pain, worsening of pain with activity and weight bearing, pain that interferes with activities of daily living, pain with passive range of motion, crepitus and joint swelling.  Patient has evidence of periarticular osteophytes and joint space narrowing by imaging studies.  There is no active infection.  Risks, benefits and expectations were discussed with the patient.  Risks including but not limited to the risk of anesthesia, blood clots, nerve damage, blood vessel damage, failure of the prosthesis, infection and up to and including death.  Patient understand the risks, benefits and expectations and wishes to proceed with surgery.   PCP: Thressa Sheller, MD  D/C Plans:      Home with HHPT  Post-op Meds:       No Rx given  Tranexamic Acid:      To be given - IV   Decadron:      Is to be given  FYI:     Xarelto (doesn't tolerate ASA)  Oxycodone  Send home on prednisone dose pack  Meprilex dressing    Patient Active Problem List   Diagnosis Date Noted  . 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 Fibromyalgia 11/24/2011  . Lumbar degenerative disc disease 11/24/2011   Past Medical History:  Diagnosis Date  . Anemia    hx of as child and early adult   . Claustrophobia   . Fibromyalgia    . H/O fibromyalgia   . [id number])     Past Surgical History:  Procedure Laterality Date  . BUNIONECTOMY     bil  . FINGER SURGERY     right ring finger /1994  . KNEE ARTHROSCOPY Right   . KNEE SURGERY     left / arthroscopic  . TOTAL KNEE ARTHROPLASTY Right 02/15/2014   Procedure: RIGHT TOTAL KNEE ARTHROPLASTY;  Surgeon: Mauri Pole, MD;  Location: [facility];  Service: Orthopedics;  Laterality: Right;    No prescriptions prior to admission.   Allergies  Allergen Reactions  . Silver Other (See Comments)    swelling and blisters  . Tape Rash and Other (See Comments)    Redness    . Nickel     Blisters   . Penicillins     Knots under arm Has patient had a PCN reaction causing immediate rash, facial/tongue/throat swelling, SOB or lightheadedness with hypotension:No Has patient had a PCN reaction causing severe rash involving mucus membranes or skin necrosis:No Has patient had a PCN reaction that required hospitalization:No Has patient had a PCN reaction occurring within the last 10 years:No If all of the above answers are "NO", then may proceed with Cephalosporin use.     Social History  Substance Use Topics  . Smoking status: Never Smoker  . Smokeless tobacco: Never Used  . Alcohol use 0.6 oz/week  1 Glasses of wine per week     Comment: three times a week     Family History  Problem Relation Age of Onset  . Colon polyps Brother   . Colon cancer Sister      Review of Systems  Constitutional: Negative.   Eyes: Negative.   Respiratory: Negative.   Cardiovascular: Negative.   Gastrointestinal: Negative.   Genitourinary: Negative.   Musculoskeletal: Positive for joint pain.  Skin: Negative.   Neurological: Positive for headaches.  Endo/Heme/Allergies: Negative.   Psychiatric/Behavioral: Negative.     Objective:  Physical Exam  Constitutional: She is oriented to person, place, and time. She appears well-developed.  HENT:  Head: Normocephalic.   Eyes: Pupils are equal, round, and reactive to light.  Neck: Neck supple. No JVD present. No tracheal deviation present. No thyromegaly present.  Cardiovascular: Normal rate, regular rhythm, normal heart sounds and intact distal pulses.   Respiratory: Effort normal and breath sounds normal. No respiratory distress. She has no wheezes.  GI: Soft. There is no tenderness. There is no guarding.  Musculoskeletal:       Left knee: She exhibits decreased range of motion, swelling and bony tenderness. She exhibits no ecchymosis, no deformity, no laceration and no erythema. Tenderness found.  Lymphadenopathy:    She has no cervical adenopathy.  Neurological: She is alert and oriented to person, place, and time. A sensory deficit (numbness and tingling in left and right LEs, left > right) is present.  Skin: Skin is warm and dry.  Psychiatric: She has a normal mood and affect.     Labs:  Estimated body mass index is 20.5 kg/m as calculated from the following:   Height as of 02/15/14: 5\' 11"  (1.803 m).   Weight as of 02/15/14: 66.7 kg (147 lb).   Imaging Review Plain radiographs demonstrate severe degenerative joint disease of the left knee(s).  The bone quality appears to be good for age and reported activity level.  Assessment/Plan:  End stage arthritis, left knee   The patient history, physical examination, clinical judgment of the provider and imaging studies are consistent with end stage degenerative joint disease of the left knee(s) and total knee arthroplasty is deemed medically necessary. The treatment options including medical management, injection therapy arthroscopy and arthroplasty were discussed at length. The risks and benefits of total knee arthroplasty were presented and reviewed. The risks due to aseptic loosening, infection, stiffness, patella tracking problems, thromboembolic complications and other imponderables were discussed. The patient acknowledged the explanation, agreed to  proceed with the plan and consent was signed. Patient is being admitted for inpatient treatment for surgery, pain control, PT, OT, prophylactic antibiotics, VTE prophylaxis, progressive ambulation and ADL's and discharge planning. The patient is planning to be discharged home with home health services.     [place] Mustaf Antonacci   PA-C  03/13/2016, 1:24 PM

## 2016-03-14 ENCOUNTER — Encounter (HOSPITAL_COMMUNITY)
Admission: RE | Admit: 2016-03-14 | Discharge: 2016-03-14 | Disposition: A | Discharge status: Home / Self Care | Payer: Managed Care, Other (non HMO) | Source: Ambulatory Visit | Attending: Orthopedic Surgery | Admitting: Orthopedic Surgery

## 2016-03-14 ENCOUNTER — Encounter (HOSPITAL_COMMUNITY): Payer: Self-pay

## 2016-03-14 DIAGNOSIS — M1712 Unilateral primary osteoarthritis, left knee: Secondary | ICD-10-CM | POA: Diagnosis not present

## 2016-03-14 DIAGNOSIS — Z01812 Encounter for preprocedural laboratory examination: Secondary | ICD-10-CM | POA: Diagnosis not present

## 2016-03-14 DIAGNOSIS — Z01818 Encounter for other preprocedural examination: Secondary | ICD-10-CM | POA: Diagnosis not present

## 2016-03-14 HISTORY — DX: Unspecified osteoarthritis, unspecified site: M19.90

## 2016-03-14 LAB — CBC
HEMATOCRIT: 39.4 % (ref 36.0–46.0)
HEMOGLOBIN: 13.6 g/dL (ref 12.0–15.0)
MCH: 32.2 pg (ref 26.0–34.0)
MCHC: 34.5 g/dL (ref 30.0–36.0)
MCV: 93.1 fL (ref 78.0–100.0)
Platelets: 255 10*3/uL (ref 150–400)
RBC: 4.23 MIL/uL (ref 3.87–5.11)
RDW: 12.7 % (ref 11.5–15.5)
WBC: 5.4 10*3/uL (ref 4.0–10.5)

## 2016-03-14 LAB — BASIC METABOLIC PANEL
ANION GAP: 6 (ref 5–15)
BUN: 14 mg/dL (ref 6–20)
CALCIUM: 9.5 mg/dL (ref 8.9–10.3)
CO2: 29 mmol/L (ref 22–32)
Chloride: 105 mmol/L (ref 101–111)
Creatinine, Ser: 0.85 mg/dL (ref 0.44–1.00)
Glucose, Bld: 94 mg/dL (ref 65–99)
POTASSIUM: 3.7 mmol/L (ref 3.5–5.1)
SODIUM: 140 mmol/L (ref 135–145)

## 2016-03-14 LAB — TYPE AND SCREEN
ABO/RH(D): O POS
Antibody Screen: NEGATIVE

## 2016-03-14 LAB — SURGICAL PCR SCREEN
MRSA, PCR: NEGATIVE
Staphylococcus aureus: NEGATIVE

## 2016-03-14 NOTE — Progress Notes (Signed)
Patient states  ? Latex allergy. I called her after preop appointment and told her Dr Alvan Dame had listed on Surgery Schedule.  I added to Allergies in EPIC.

## 2016-03-14 NOTE — Progress Notes (Signed)
01/23/2016- Clearance- Dr Alyson Ingles on on chart   01/23/16- EKG on chart

## 2016-03-24 NOTE — Anesthesia Preprocedure Evaluation (Addendum)
Anesthesia Evaluation  Patient identified by MRN, date of birth, ID band Patient awake    Reviewed: Allergy & Precautions, H&P , NPO status , Patient's Chart, lab work & pertinent test results  Airway Mallampati: II  TM Distance: >3 FB Neck ROM: Full    Dental no notable dental hx. (+) Teeth Intact, Dental Advisory Given   Pulmonary neg pulmonary ROS,    Pulmonary exam normal breath sounds clear to auscultation       Cardiovascular negative cardio ROS   Rhythm:Regular Rate:Normal     Neuro/Psych Anxiety negative neurological ROS  negative psych ROS   GI/Hepatic negative GI ROS, Neg liver ROS,   Endo/Other  negative endocrine ROS  Renal/GU negative Renal ROS  negative genitourinary   Musculoskeletal  (+) Arthritis , Osteoarthritis,  Fibromyalgia -  Abdominal   Peds  Hematology negative hematology ROS (+)   Anesthesia Other Findings   Reproductive/Obstetrics negative OB ROS                            Anesthesia Physical Anesthesia Plan  ASA: II  Anesthesia Plan: MAC, Spinal and Regional   Post-op Pain Management:  Regional for Post-op pain   Induction: Intravenous  Airway Management Planned: Simple Face Mask  Additional Equipment:   Intra-op Plan:   Post-operative Plan:   Informed Consent: I have reviewed the patients History and Physical, chart, labs and discussed the procedure including the risks, benefits and alternatives for the proposed anesthesia with the patient or authorized representative who has indicated his/her understanding and acceptance.   Dental advisory given  Plan Discussed with: CRNA  Anesthesia Plan Comments:        Anesthesia Quick Evaluation

## 2016-03-25 ENCOUNTER — Inpatient Hospital Stay (HOSPITAL_COMMUNITY): Payer: Managed Care, Other (non HMO) | Admitting: Anesthesiology

## 2016-03-25 ENCOUNTER — Inpatient Hospital Stay (HOSPITAL_COMMUNITY)
Admission: RE | Admit: 2016-03-25 | Discharge: 2016-03-26 | DRG: 470 | Disposition: A | Discharge status: Home Health | Payer: Managed Care, Other (non HMO) | Source: Ambulatory Visit | Attending: Orthopedic Surgery | Admitting: Orthopedic Surgery

## 2016-03-25 ENCOUNTER — Encounter (HOSPITAL_COMMUNITY): Payer: Self-pay | Admitting: *Deleted

## 2016-03-25 ENCOUNTER — Encounter (HOSPITAL_COMMUNITY)
Admission: RE | Disposition: A | Discharge status: Home Health | Payer: Self-pay | Source: Ambulatory Visit | Attending: Orthopedic Surgery

## 2016-03-25 DIAGNOSIS — Z96651 Presence of right artificial knee joint: Secondary | ICD-10-CM | POA: Diagnosis present

## 2016-03-25 DIAGNOSIS — F40240 Claustrophobia: Secondary | ICD-10-CM | POA: Diagnosis present

## 2016-03-25 DIAGNOSIS — Z8371 Family history of colonic polyps: Secondary | ICD-10-CM

## 2016-03-25 DIAGNOSIS — Z880 Allergy status to penicillin: Secondary | ICD-10-CM

## 2016-03-25 DIAGNOSIS — M1712 Unilateral primary osteoarthritis, left knee: Principal | ICD-10-CM | POA: Diagnosis present

## 2016-03-25 DIAGNOSIS — Z800 Family history of malignant neoplasm of digestive organs: Secondary | ICD-10-CM

## 2016-03-25 DIAGNOSIS — Z96659 Presence of unspecified artificial knee joint: Secondary | ICD-10-CM

## 2016-03-25 DIAGNOSIS — M797 Fibromyalgia: Secondary | ICD-10-CM | POA: Diagnosis present

## 2016-03-25 DIAGNOSIS — Z91048 Other nonmedicinal substance allergy status: Secondary | ICD-10-CM

## 2016-03-25 DIAGNOSIS — M25562 Pain in left knee: Secondary | ICD-10-CM | POA: Diagnosis present

## 2016-03-25 HISTORY — PX: TOTAL KNEE ARTHROPLASTY: SHX125

## 2016-03-25 SURGERY — ARTHROPLASTY, KNEE, TOTAL
Anesthesia: Monitor Anesthesia Care | Site: Knee | Laterality: Left

## 2016-03-25 MED ORDER — FENTANYL CITRATE (PF) 100 MCG/2ML IJ SOLN
INTRAMUSCULAR | Status: AC
  Filled 2016-03-25: qty 2

## 2016-03-25 MED ORDER — METOCLOPRAMIDE HCL 5 MG PO TABS: 5.0000 mg | ORAL_TABLET | Freq: Three times a day (TID) | ORAL | Status: DC | PRN

## 2016-03-25 MED ORDER — ALUM & MAG HYDROXIDE-SIMETH 200-200-20 MG/5ML PO SUSP: 30.0000 mL | ORAL | Status: DC | PRN

## 2016-03-25 MED ORDER — DEXTROSE 5 % IV SOLN
500.0000 mg | Freq: Four times a day (QID) | INTRAVENOUS | Status: DC | PRN
  Filled 2016-03-25: qty 5

## 2016-03-25 MED ORDER — DEXAMETHASONE SODIUM PHOSPHATE 10 MG/ML IJ SOLN
INTRAMUSCULAR | Status: AC
Start: 2016-03-25 — End: 2016-03-25
  Filled 2016-03-25: qty 1

## 2016-03-25 MED ORDER — MENTHOL 3 MG MT LOZG: 1.0000 | LOZENGE | OROMUCOSAL | Status: DC | PRN

## 2016-03-25 MED ORDER — CELECOXIB 200 MG PO CAPS
200.0000 mg | ORAL_CAPSULE | Freq: Two times a day (BID) | ORAL | Status: DC
  Administered 2016-03-25 – 2016-03-26 (×2): 200 mg via ORAL
  Filled 2016-03-25 (×2): qty 1

## 2016-03-25 MED ORDER — ROPIVACAINE HCL 7.5 MG/ML IJ SOLN
INTRAMUSCULAR | Status: DC | PRN
  Administered 2016-03-25: 20 mL via PERINEURAL

## 2016-03-25 MED ORDER — DOCUSATE SODIUM 100 MG PO CAPS
100.0000 mg | ORAL_CAPSULE | Freq: Two times a day (BID) | ORAL | Status: DC
  Administered 2016-03-25 – 2016-03-26 (×2): 100 mg via ORAL
  Filled 2016-03-25 (×2): qty 1

## 2016-03-25 MED ORDER — ONDANSETRON HCL 4 MG/2ML IJ SOLN
INTRAMUSCULAR | Status: AC
  Filled 2016-03-25: qty 2

## 2016-03-25 MED ORDER — ONDANSETRON HCL 4 MG/2ML IJ SOLN
4.0000 mg | Freq: Four times a day (QID) | INTRAMUSCULAR | Status: DC | PRN
Start: 2016-03-25 — End: 2016-03-26

## 2016-03-25 MED ORDER — ACETAMINOPHEN 500 MG PO TABS
1000.0000 mg | ORAL_TABLET | Freq: Three times a day (TID) | ORAL | Status: DC
  Administered 2016-03-25 – 2016-03-26 (×3): 1000 mg via ORAL
  Filled 2016-03-25 (×3): qty 2

## 2016-03-25 MED ORDER — AMITRIPTYLINE HCL 25 MG PO TABS
25.0000 mg | ORAL_TABLET | Freq: Every day | ORAL | Status: DC
  Administered 2016-03-25: 25 mg via ORAL
  Filled 2016-03-25: qty 1

## 2016-03-25 MED ORDER — 0.9 % SODIUM CHLORIDE (POUR BTL) OPTIME
TOPICAL | Status: DC | PRN
  Administered 2016-03-25: 1000 mL

## 2016-03-25 MED ORDER — BISACODYL 10 MG RE SUPP: 10.0000 mg | Freq: Every day | RECTAL | Status: DC | PRN

## 2016-03-25 MED ORDER — STERILE WATER FOR IRRIGATION IR SOLN
Status: DC | PRN
  Administered 2016-03-25: 2000 mL

## 2016-03-25 MED ORDER — BUPIVACAINE HCL (PF) 0.25 % IJ SOLN
INTRAMUSCULAR | Status: DC | PRN
  Administered 2016-03-25: 30 mL

## 2016-03-25 MED ORDER — PHENYLEPHRINE 40 MCG/ML (10ML) SYRINGE FOR IV PUSH (FOR BLOOD PRESSURE SUPPORT)
PREFILLED_SYRINGE | INTRAVENOUS | Status: AC
  Filled 2016-03-25: qty 10

## 2016-03-25 MED ORDER — ROPIVACAINE HCL 7.5 MG/ML IJ SOLN
INTRAMUSCULAR | Status: AC
  Filled 2016-03-25: qty 20

## 2016-03-25 MED ORDER — LACTATED RINGERS IV SOLN
INTRAVENOUS | Status: DC | PRN
  Administered 2016-03-25 (×3): via INTRAVENOUS

## 2016-03-25 MED ORDER — KETOROLAC TROMETHAMINE 30 MG/ML IJ SOLN
INTRAMUSCULAR | Status: DC | PRN
  Administered 2016-03-25: 30 mg

## 2016-03-25 MED ORDER — DEXAMETHASONE SODIUM PHOSPHATE 10 MG/ML IJ SOLN
10.0000 mg | Freq: Once | INTRAMUSCULAR | Status: AC
  Administered 2016-03-26: 10 mg via INTRAVENOUS
  Filled 2016-03-25: qty 1

## 2016-03-25 MED ORDER — MAGNESIUM CITRATE PO SOLN: 1.0000 | Freq: Once | ORAL | Status: DC | PRN

## 2016-03-25 MED ORDER — BUPIVACAINE IN DEXTROSE 0.75-8.25 % IT SOLN
INTRATHECAL | Status: DC | PRN
  Administered 2016-03-25: 13.5 mg via INTRATHECAL

## 2016-03-25 MED ORDER — OXYCODONE HCL 5 MG PO TABS
5.0000 mg | ORAL_TABLET | ORAL | Status: DC
  Administered 2016-03-25 – 2016-03-26 (×6): 5 mg via ORAL
  Filled 2016-03-25 (×6): qty 1

## 2016-03-25 MED ORDER — CLINDAMYCIN PHOSPHATE 900 MG/50ML IV SOLN
900.0000 mg | Freq: Four times a day (QID) | INTRAVENOUS | Status: AC
  Administered 2016-03-25 (×2): 900 mg via INTRAVENOUS
  Filled 2016-03-25 (×2): qty 50

## 2016-03-25 MED ORDER — DIAZEPAM 2 MG PO TABS: 2.0000 mg | ORAL_TABLET | Freq: Every evening | ORAL | Status: DC | PRN

## 2016-03-25 MED ORDER — SODIUM CHLORIDE 0.9 % IV SOLN
INTRAVENOUS | Status: DC
  Administered 2016-03-25: 13:00:00 via INTRAVENOUS
  Filled 2016-03-25 (×4): qty 1000

## 2016-03-25 MED ORDER — POLYETHYLENE GLYCOL 3350 17 G PO PACK
17.0000 g | PACK | Freq: Two times a day (BID) | ORAL | Status: DC
  Administered 2016-03-25 – 2016-03-26 (×2): 17 g via ORAL
  Filled 2016-03-25 (×2): qty 1

## 2016-03-25 MED ORDER — PHENOL 1.4 % MT LIQD
1.0000 | OROMUCOSAL | Status: DC | PRN
  Filled 2016-03-25: qty 177

## 2016-03-25 MED ORDER — ONDANSETRON HCL 4 MG/2ML IJ SOLN
INTRAMUSCULAR | Status: DC | PRN
  Administered 2016-03-25: 4 mg via INTRAVENOUS

## 2016-03-25 MED ORDER — FERROUS SULFATE 325 (65 FE) MG PO TABS
325.0000 mg | ORAL_TABLET | Freq: Three times a day (TID) | ORAL | Status: DC
  Administered 2016-03-25 – 2016-03-26 (×2): 325 mg via ORAL
  Filled 2016-03-25 (×2): qty 1

## 2016-03-25 MED ORDER — PROPOFOL 500 MG/50ML IV EMUL
INTRAVENOUS | Status: DC | PRN
  Administered 2016-03-25: 75 ug/kg/min via INTRAVENOUS

## 2016-03-25 MED ORDER — CLINDAMYCIN PHOSPHATE 900 MG/50ML IV SOLN
900.0000 mg | Freq: Once | INTRAVENOUS | Status: AC
  Administered 2016-03-25: 900 mg via INTRAVENOUS
  Filled 2016-03-25: qty 50

## 2016-03-25 MED ORDER — GABAPENTIN 300 MG PO CAPS
300.0000 mg | ORAL_CAPSULE | Freq: Once | ORAL | Status: AC
  Administered 2016-03-25: 300 mg via ORAL
  Filled 2016-03-25: qty 1

## 2016-03-25 MED ORDER — FENTANYL CITRATE (PF) 100 MCG/2ML IJ SOLN
INTRAMUSCULAR | Status: DC | PRN
  Administered 2016-03-25 (×2): 50 ug via INTRAVENOUS

## 2016-03-25 MED ORDER — HYDROMORPHONE HCL 1 MG/ML IJ SOLN: 0.5000 mg | INTRAMUSCULAR | Status: DC | PRN

## 2016-03-25 MED ORDER — METHOCARBAMOL 500 MG PO TABS
500.0000 mg | ORAL_TABLET | Freq: Four times a day (QID) | ORAL | Status: DC | PRN
  Administered 2016-03-26 (×2): 500 mg via ORAL
  Filled 2016-03-25 (×2): qty 1

## 2016-03-25 MED ORDER — CHLORHEXIDINE GLUCONATE 4 % EX LIQD: 60.0000 mL | Freq: Once | CUTANEOUS | Status: DC

## 2016-03-25 MED ORDER — ACETAMINOPHEN 10 MG/ML IV SOLN
1000.0000 mg | Freq: Once | INTRAVENOUS | Status: AC
  Administered 2016-03-25: 1000 mg via INTRAVENOUS

## 2016-03-25 MED ORDER — METOCLOPRAMIDE HCL 5 MG/ML IJ SOLN: 5.0000 mg | Freq: Three times a day (TID) | INTRAMUSCULAR | Status: DC | PRN

## 2016-03-25 MED ORDER — PROPOFOL 10 MG/ML IV BOLUS
INTRAVENOUS | Status: AC
  Filled 2016-03-25: qty 60

## 2016-03-25 MED ORDER — SODIUM CHLORIDE 0.9 % IJ SOLN
INTRAMUSCULAR | Status: AC
  Filled 2016-03-25: qty 50

## 2016-03-25 MED ORDER — ACETAMINOPHEN 10 MG/ML IV SOLN
INTRAVENOUS | Status: AC
  Filled 2016-03-25: qty 100

## 2016-03-25 MED ORDER — LIDOCAINE 2% (20 MG/ML) 5 ML SYRINGE
INTRAMUSCULAR | Status: DC | PRN
  Administered 2016-03-25: 60 mg via INTRAVENOUS

## 2016-03-25 MED ORDER — LIDOCAINE 2% (20 MG/ML) 5 ML SYRINGE
INTRAMUSCULAR | Status: AC
  Filled 2016-03-25: qty 5

## 2016-03-25 MED ORDER — RIVAROXABAN 10 MG PO TABS
10.0000 mg | ORAL_TABLET | ORAL | Status: DC
  Administered 2016-03-26: 10 mg via ORAL
  Filled 2016-03-25: qty 1

## 2016-03-25 MED ORDER — MIDAZOLAM HCL 5 MG/5ML IJ SOLN
INTRAMUSCULAR | Status: DC | PRN
  Administered 2016-03-25 (×2): 1 mg via INTRAVENOUS

## 2016-03-25 MED ORDER — TRANEXAMIC ACID 1000 MG/10ML IV SOLN
1000.0000 mg | INTRAVENOUS | Status: AC
  Administered 2016-03-25: 1000 mg via INTRAVENOUS
  Filled 2016-03-25: qty 1100

## 2016-03-25 MED ORDER — DIPHENHYDRAMINE HCL 25 MG PO CAPS: 25.0000 mg | ORAL_CAPSULE | Freq: Four times a day (QID) | ORAL | Status: DC | PRN

## 2016-03-25 MED ORDER — KETOROLAC TROMETHAMINE 30 MG/ML IJ SOLN
INTRAMUSCULAR | Status: AC
  Filled 2016-03-25: qty 1

## 2016-03-25 MED ORDER — MIDAZOLAM HCL 2 MG/2ML IJ SOLN
INTRAMUSCULAR | Status: AC
  Filled 2016-03-25: qty 2

## 2016-03-25 MED ORDER — DEXAMETHASONE SODIUM PHOSPHATE 10 MG/ML IJ SOLN
10.0000 mg | Freq: Once | INTRAMUSCULAR | Status: AC
  Administered 2016-03-25: 10 mg via INTRAVENOUS

## 2016-03-25 MED ORDER — HYDROMORPHONE HCL 1 MG/ML IJ SOLN: 0.2500 mg | INTRAMUSCULAR | Status: DC | PRN

## 2016-03-25 MED ORDER — ONDANSETRON HCL 4 MG PO TABS: 4.0000 mg | ORAL_TABLET | Freq: Four times a day (QID) | ORAL | Status: DC | PRN

## 2016-03-25 MED ORDER — SODIUM CHLORIDE 0.9 % IJ SOLN
INTRAMUSCULAR | Status: DC | PRN
  Administered 2016-03-25: 29 mL

## 2016-03-25 MED ORDER — PHENYLEPHRINE HCL 10 MG/ML IJ SOLN
INTRAMUSCULAR | Status: DC | PRN
  Administered 2016-03-25 (×5): 80 ug via INTRAVENOUS

## 2016-03-25 MED ORDER — CEFAZOLIN SODIUM-DEXTROSE 2-4 GM/100ML-% IV SOLN: 2.0000 g | INTRAVENOUS | Status: DC

## 2016-03-25 MED ORDER — BUPIVACAINE HCL (PF) 0.25 % IJ SOLN
INTRAMUSCULAR | Status: AC
Start: 2016-03-25 — End: 2016-03-25
  Filled 2016-03-25: qty 30

## 2016-03-25 MED ORDER — SODIUM CHLORIDE 0.9 % IR SOLN
Status: DC | PRN
  Administered 2016-03-25: 1000 mL

## 2016-03-25 SURGICAL SUPPLY — 51 items
ADH SKN CLS APL DERMABOND .7 (GAUZE/BANDAGES/DRESSINGS) ×1
BAG SPEC THK2 15X12 ZIP CLS (MISCELLANEOUS) ×1
BAG ZIPLOCK 12X15 (MISCELLANEOUS) ×1 IMPLANT
BANDAGE ACE 6X5 VEL STRL LF (GAUZE/BANDAGES/DRESSINGS) ×2 IMPLANT
BLADE SAW SGTL 13.0X1.19X90.0M (BLADE) ×2 IMPLANT
BOWL SMART MIX CTS (DISPOSABLE) ×2 IMPLANT
CAPT KNEE TOTAL 3 ×1 IMPLANT
CEMENT HV SMART SET (Cement) ×2 IMPLANT
CLOTH BEACON ORANGE TIMEOUT ST (SAFETY) ×2 IMPLANT
CUFF TOURN SGL QUICK 34 (TOURNIQUET CUFF) ×2
CUFF TRNQT CYL 34X4X40X1 (TOURNIQUET CUFF) ×1 IMPLANT
DECANTER SPIKE VIAL GLASS SM (MISCELLANEOUS) ×2 IMPLANT
DERMABOND ADVANCED (GAUZE/BANDAGES/DRESSINGS) ×1
DERMABOND ADVANCED .7 DNX12 (GAUZE/BANDAGES/DRESSINGS) ×1 IMPLANT
DRAPE U-SHAPE 47X51 STRL (DRAPES) ×2 IMPLANT
DRESSING AQUACEL AG SP 3.5X10 (GAUZE/BANDAGES/DRESSINGS) ×1 IMPLANT
DRSG AQUACEL AG SP 3.5X10 (GAUZE/BANDAGES/DRESSINGS) ×2
DURAPREP 26ML APPLICATOR (WOUND CARE) ×4 IMPLANT
ELECT REM PT RETURN 9FT ADLT (ELECTROSURGICAL) ×2
ELECTRODE REM PT RTRN 9FT ADLT (ELECTROSURGICAL) ×1 IMPLANT
GLOVE BIOGEL PI IND STRL 7.0 (GLOVE) IMPLANT
GLOVE BIOGEL PI IND STRL 7.5 (GLOVE) ×1 IMPLANT
GLOVE BIOGEL PI IND STRL 8 (GLOVE) IMPLANT
GLOVE BIOGEL PI IND STRL 8.5 (GLOVE) ×1 IMPLANT
GLOVE BIOGEL PI INDICATOR 7.0 (GLOVE) ×1
GLOVE BIOGEL PI INDICATOR 7.5 (GLOVE) ×4
GLOVE BIOGEL PI INDICATOR 8 (GLOVE) ×2
GLOVE BIOGEL PI INDICATOR 8.5 (GLOVE) ×1
GLOVE SURG SS PI 7.0 STRL IVOR (GLOVE) ×1 IMPLANT
GLOVE SURG SS PI 7.5 STRL IVOR (GLOVE) ×2 IMPLANT
GLOVE SURG SS PI 8.5 STRL IVOR (GLOVE) ×1
GLOVE SURG SS PI 8.5 STRL STRW (GLOVE) IMPLANT
GOWN STRL REUS W/ TWL XL LVL3 (GOWN DISPOSABLE) IMPLANT
GOWN STRL REUS W/TWL LRG LVL3 (GOWN DISPOSABLE) ×3 IMPLANT
GOWN STRL REUS W/TWL XL LVL3 (GOWN DISPOSABLE) ×4 IMPLANT
HANDPIECE INTERPULSE COAX TIP (DISPOSABLE) ×2
INSERT SPEED PIN RIMMED 45MM (Knees) ×1 IMPLANT
MANIFOLD NEPTUNE II (INSTRUMENTS) ×2 IMPLANT
PACK TOTAL KNEE CUSTOM (KITS) ×2 IMPLANT
POSITIONER SURGICAL ARM (MISCELLANEOUS) ×2 IMPLANT
SET HNDPC FAN SPRY TIP SCT (DISPOSABLE) ×1 IMPLANT
SET PAD KNEE POSITIONER (MISCELLANEOUS) ×2 IMPLANT
SUT MNCRL AB 4-0 PS2 18 (SUTURE) ×2 IMPLANT
SUT VIC AB 1 CT1 36 (SUTURE) ×2 IMPLANT
SUT VIC AB 2-0 CT1 27 (SUTURE) ×6
SUT VIC AB 2-0 CT1 TAPERPNT 27 (SUTURE) ×3 IMPLANT
SUT VLOC 180 0 24IN GS25 (SUTURE) ×2 IMPLANT
SYR 50ML LL SCALE MARK (SYRINGE) ×2 IMPLANT
TRAY FOLEY BAG SILVER LF 16FR (SET/KITS/TRAYS/PACK) ×1 IMPLANT
WRAP KNEE MAXI GEL POST OP (GAUZE/BANDAGES/DRESSINGS) ×2 IMPLANT
YANKAUER SUCT BULB TIP 10FT TU (MISCELLANEOUS) ×2 IMPLANT

## 2016-03-25 NOTE — Anesthesia Procedure Notes (Signed)
Procedure Name: MAC Date/Time: 03/25/2016 7:37 AM Performed by: Dione Booze Oxygen Delivery Method: Simple face mask Placement Confirmation: positive ETCO2

## 2016-03-25 NOTE — Interval H&P Note (Signed)
History and Physical Interval Note:  03/25/2016 7:21 AM  Jodi Gutierrez  has presented today for surgery, with the diagnosis of LEFT KNEE OA  The various methods of treatment have been discussed with the patient and family. After consideration of risks, benefits and other options for treatment, the patient has consented to  Procedure(s): LEFT TOTAL KNEE ARTHROPLASTY (Left) as a surgical intervention .  The patient's history has been reviewed, patient examined, no change in status, stable for surgery.  I have reviewed the patient's chart and labs.  Questions were answered to the patient's satisfaction.     Mauri Pole

## 2016-03-25 NOTE — Anesthesia Procedure Notes (Addendum)
Anesthesia Regional Block:  Adductor canal block  Pre-Anesthetic Checklist: ,, timeout performed, Correct Patient, Correct Site, Correct Laterality, Correct Procedure, Correct Position, site marked, Risks and benefits discussed, pre-op evaluation,  At surgeon's request and post-op pain management  Laterality: Left  Prep: Maximum Sterile Barrier Precautions used, chloraprep       Needles:  Injection technique: Single-shot  Needle Type: Echogenic Stimulator Needle     Needle Length: 9cm 9 cm Needle Gauge: 21 and 21 G    Additional Needles:  Procedures: ultrasound guided (picture in chart) Adductor canal block Narrative:  Start time: 03/25/2016 7:00 AM End time: 03/25/2016 7:09 AM Injection made incrementally with aspirations every 5 mL. Anesthesiologist: Roderic Palau  Additional Notes: 2% Lidocaine skin wheel.

## 2016-03-25 NOTE — Anesthesia Postprocedure Evaluation (Signed)
Anesthesia Post Note  Patient: Jodi Gutierrez  Procedure(s) Performed: Procedure(s) (LRB): LEFT TOTAL KNEE ARTHROPLASTY (Left)  Patient location during evaluation: PACU Anesthesia Type: Spinal and MAC Level of consciousness: awake and alert Pain management: pain level controlled Vital Signs Assessment: post-procedure vital signs reviewed and stable Respiratory status: spontaneous breathing and respiratory function stable Cardiovascular status: blood pressure returned to baseline and stable Postop Assessment: spinal receding Anesthetic complications: no    Last Vitals:  Vitals:   03/25/16 1115 03/25/16 1130  BP: (!) 142/76 (!) 143/77  Pulse: 100 97  Resp: 16 14  Temp:  36.4 C    Last Pain:  Vitals:   03/25/16 1130  TempSrc:   PainSc: 0-No pain    LLE Motor Response: Purposeful movement (03/25/16 1130) LLE Sensation: Decreased (03/25/16 1130) RLE Motor Response: Purposeful movement (03/25/16 1130) RLE Sensation: Decreased (03/25/16 1130) L Sensory Level: L3-Anterior knee, lower leg (03/25/16 1130) R Sensory Level: L3-Anterior knee, lower leg (03/25/16 1130)  Tristan Proto,W. [place]MON[place]

## 2016-03-25 NOTE — Evaluation (Signed)
Physical Therapy Evaluation Patient Details Name: Jodi Gutierrez MRN: [id number] DOB: 03/08/1951 Today's Date: 03/25/2016   History of Present Illness  L TKA, adductor block  Clinical Impression  *the patient ambulated x 100', Left knee flexion to 90 degrees. Pt admitted with above diagnosis. Pt currently with functional limitations due to the deficits listed below (see PT Problem List). Pt will benefit from skilled PT to increase their independence and safety with mobility to allow discharge to the venue listed below.      Follow Up Recommendations Home health PT;Supervision - Intermittent    Equipment Recommendations  None recommended by PT    Recommendations for Other Services       Precautions / Restrictions Precautions Precautions: Fall;Knee      Mobility  Bed Mobility Overal bed mobility: Needs Assistance Bed Mobility: Supine to Sit     Supine to sit: Supervision        Transfers Overall transfer level: Needs assistance Equipment used: Rolling walker (2 wheeled) Transfers: Sit to/from Stand Sit to Stand: Min assist         General transfer comment: cues for hand placement, flexes the left kne for sit to stand  Ambulation/Gait Ambulation/Gait assistance: Min assist Ambulation Distance (Feet): 100 Feet Assistive device: Rolling walker (2 wheeled) Gait Pattern/deviations: Step-through pattern     General Gait Details: not antalgic, smoothe gait  Stairs            Wheelchair Mobility    Modified Rankin (Stroke Patients Only)       Balance                                             Pertinent Vitals/Pain Pain Assessment: 0-10 Pain Score: 3  Pain Location: Left knee Pain Descriptors / Indicators: Sore Pain Intervention(s): Ice applied;Repositioned;Premedicated before session;Monitored during session    Home Living Family/patient expects to be discharged to:: Private residence Living Arrangements: Spouse/significant  other Available Help at Discharge: Family Type of Home: House Home Access: Stairs to enter Entrance Stairs-Rails: None Entrance Stairs-Number of Steps: 1+ 1 Home Layout: One level Home Equipment: Environmental consultant - 2 wheels;Cane - single point;Crutches      Prior Function                 Hand Dominance        Extremity/Trunk Assessment   Upper Extremity Assessment: Defer to OT evaluation           Lower Extremity Assessment: LLE deficits/detail   LLE Deficits / Details: + SLR, knee flexion to 90*  Cervical / Trunk Assessment: Normal  Communication      Cognition Arousal/Alertness: Awake/alert Behavior During Therapy: WFL for tasks assessed/performed Overall Cognitive Status: Within Functional Limits for tasks assessed                      General Comments      Exercises     Assessment/Plan    PT Assessment Patient needs continued PT services  PT Problem List Decreased strength;Decreased range of motion;Decreased activity tolerance;Decreased mobility;Decreased knowledge of precautions;Decreased safety awareness;Decreased knowledge of use of DME;Pain          PT Treatment Interventions DME instruction;Gait training;Stair training;Functional mobility training;Therapeutic activities;Therapeutic exercise;Patient/family education    PT Goals (Current goals can be found in the Care Plan section)  Acute Rehab PT Goals Patient  Stated Goal: to go home PT Goal Formulation: With patient/family Time For Goal Achievement: 03/28/16 Potential to Achieve Goals: Good    Frequency 7X/week   Barriers to discharge        Co-evaluation               End of Session   Activity Tolerance: Patient tolerated treatment well Patient left: in chair;with call bell/phone within reach;with chair alarm set;with family/visitor present Nurse Communication: Mobility status         Time: [id number] PT Time Calculation (min) (ACUTE ONLY): 13 min   Charges:   PT  Evaluation $PT Eval Low Complexity: 1 Procedure     PT G CodesClaretha Cooper 03/25/2016, 5:38 PM Tresa Endo PT [phone]

## 2016-03-25 NOTE — Transfer of Care (Signed)
Immediate Anesthesia Transfer of Care Note  Patient: Jodi Gutierrez  Procedure(s) Performed: Procedure(s) with comments: LEFT TOTAL KNEE ARTHROPLASTY (Left) - Adductor Block  Patient Location: PACU  Anesthesia Type:MAC and Spinal  Level of Consciousness: awake, alert , oriented and patient cooperative  Airway & Oxygen Therapy: Patient Spontanous Breathing and Patient connected to face mask oxygen  Post-op Assessment: Report given to RN and Post -op Vital signs reviewed and stable  Post vital signs: Reviewed and stable  Last Vitals:  Vitals:   03/25/16 0541  BP: 134/72  Pulse: 88  Resp: 18  Temp: 36.6 C    Last Pain:  Vitals:   03/25/16 0541  TempSrc: Oral         Complications: No apparent anesthesia complications

## 2016-03-25 NOTE — Anesthesia Procedure Notes (Signed)
Spinal  Patient location during procedure: OR Start time: 03/25/2016 7:33 AM End time: 03/25/2016 7:37 AM Staffing Anesthesiologist: Roderic Palau Performed: anesthesiologist  Preanesthetic Checklist Completed: patient identified, surgical consent, pre-op evaluation, timeout performed, IV checked, risks and benefits discussed and monitors and equipment checked Spinal Block Patient position: sitting Prep: Betadine Patient monitoring: cardiac monitor, continuous pulse ox and blood pressure Approach: midline Location: L3-4 Injection technique: single-shot Needle Needle type: Pencan  Needle gauge: 24 G Needle length: 9 cm Assessment Sensory level: T8 Additional Notes Functioning IV was confirmed and monitors were applied. Sterile prep and drape, including hand hygiene and sterile gloves were used. The patient was positioned and the spine was prepped. The skin was anesthetized with lidocaine.  Free flow of clear CSF was obtained prior to injecting local anesthetic into the CSF.  The spinal needle aspirated freely following injection.  The needle was carefully withdrawn.  The patient tolerated the procedure well.

## 2016-03-25 NOTE — Discharge Instructions (Addendum)
INSTRUCTIONS AFTER JOINT REPLACEMENT   o Remove items at home which could result in a fall. This includes throw rugs or furniture in walking pathways o ICE to the affected joint every three hours while awake for 30 minutes at a time, for at least the first 3-5 days, and then as needed for pain and swelling.  Continue to use ice for pain and swelling. You may notice swelling that will progress down to the foot and ankle.  This is normal after surgery.  Elevate your leg when you are not up walking on it.   o Continue to use the breathing machine you got in the hospital (incentive spirometer) which will help keep your temperature down.  It is common for your temperature to cycle up and down following surgery, especially at night when you are not up moving around and exerting yourself.  The breathing machine keeps your lungs expanded and your temperature down.   DIET:  As you were doing prior to hospitalization, we recommend a well-balanced diet.  DRESSING / WOUND CARE / SHOWERING  You may change your dressing 3-5 days after surgery.  Then change the dressing every day with sterile gauze.  Please use good hand washing techniques before changing the dressing.  Do not use any lotions or creams on the incision until instructed by your surgeon. and You may shower 3 days after surgery, but keep the wounds dry during showering.  You may use an occlusive plastic wrap (Press'n Seal for example), NO SOAKING/SUBMERGING IN THE BATHTUB.  If the bandage gets wet, change with a clean dry gauze.  If the incision gets wet, pat the wound dry with a clean towel.  ACTIVITY  o Increase activity slowly as tolerated, but follow the weight bearing instructions below.   o No driving for 6 weeks or until further direction given by your physician.  You cannot drive while taking narcotics.  o No lifting or carrying greater than 10 lbs. until further directed by your surgeon. o Avoid periods of inactivity such as sitting longer  than an hour when not asleep. This helps prevent blood clots.  o You may return to work once you are authorized by your doctor.     WEIGHT BEARING   Weight bearing as tolerated with assist device (walker, cane, etc) as directed, use it as long as suggested by your surgeon or therapist, typically at least 4-6 weeks.   EXERCISES  Results after joint replacement surgery are often greatly improved when you follow the exercise, range of motion and muscle strengthening exercises prescribed by your doctor. Safety measures are also important to protect the joint from further injury. Any time any of these exercises cause you to have increased pain or swelling, decrease what you are doing until you are comfortable again and then slowly increase them. If you have problems or questions, call your caregiver or physical therapist for advice.   Rehabilitation is important following a joint replacement. After just a few days of immobilization, the muscles of the leg can become weakened and shrink (atrophy).  These exercises are designed to build up the tone and strength of the thigh and leg muscles and to improve motion. Often times heat used for twenty to thirty minutes before working out will loosen up your tissues and help with improving the range of motion but do not use heat for the first two weeks following surgery (sometimes heat can increase post-operative swelling).   These exercises can be done on a training (  exercise) mat, on the floor, on a table or on a bed. Use whatever works the best and is most comfortable for you.    Use music or television while you are exercising so that the exercises are a pleasant break in your day. This will make your life better with the exercises acting as a break in your routine that you can look forward to.   Perform all exercises about fifteen times, three times per day or as directed.  You should exercise both the operative leg and the other leg as well.  Exercises  include:    Quad Sets - Tighten up the muscle on the front of the thigh (Quad) and hold for 5-10 seconds.    Straight Leg Raises - With your knee straight (if you were given a brace, keep it on), lift the leg to 60 degrees, hold for 3 seconds, and slowly lower the leg.  Perform this exercise against resistance later as your leg gets stronger.   Leg Slides: Lying on your back, slowly slide your foot toward your buttocks, bending your knee up off the floor (only go as far as is comfortable). Then slowly slide your foot back down until your leg is flat on the floor again.   Angel Wings: Lying on your back spread your legs to the side as far apart as you can without causing discomfort.   Hamstring Strength:  Lying on your back, push your heel against the floor with your leg straight by tightening up the muscles of your buttocks.  Repeat, but this time bend your knee to a comfortable angle, and push your heel against the floor.  You may put a pillow under the heel to make it more comfortable if necessary.   A rehabilitation program following joint replacement surgery can speed recovery and prevent re-injury in the future due to weakened muscles. Contact your doctor or a physical therapist for more information on knee rehabilitation.    CONSTIPATION  Constipation is defined medically as fewer than three stools per week and severe constipation as less than one stool per week.  Even if you have a regular bowel pattern at home, your normal regimen is likely to be disrupted due to multiple reasons following surgery.  Combination of anesthesia, postoperative narcotics, change in appetite and fluid intake all can affect your bowels.   YOU MUST use at least one of the following options; they are listed in order of increasing strength to get the job done.  They are all available over the counter, and you may need to use some, POSSIBLY even all of these options:    Drink plenty of fluids (prune juice may be  helpful) and high fiber foods Colace 100 mg by mouth twice a day  Senokot for constipation as directed and as needed Dulcolax (bisacodyl), take with full glass of water  Miralax (polyethylene glycol) once or twice a day as needed.  If you have tried all these things and are unable to have a bowel movement in the first 3-4 days after surgery call either your surgeon or your primary doctor.    If you experience loose stools or diarrhea, hold the medications until you stool forms back up.  If your symptoms do not get better within 1 week or if they get worse, check with your doctor.  If you experience "the worst abdominal pain ever" or develop nausea or vomiting, please contact the office immediately for further recommendations for treatment.   ITCHING:  If  you experience itching with your medications, try taking only a single pain pill, or even half a pain pill at a time.  You can also use Benadryl over the counter for itching or also to help with sleep.  ° °TED HOSE STOCKINGS:  Use stockings on both legs until for at least 2 weeks or as directed by physician office. They may be removed at night for sleeping. ° °MEDICATIONS:  See your medication summary on the “After Visit Summary” that nursing will review with you.  You may have some home medications which will be placed on hold until you complete the course of blood thinner medication.  It is important for you to complete the blood thinner medication as prescribed. ° °PRECAUTIONS:  If you experience chest pain or shortness of breath - call 911 immediately for transfer to the hospital emergency department.  ° °If you develop a fever greater that 101 F, purulent drainage from wound, increased redness or drainage from wound, foul odor from the wound/dressing, or calf pain - CONTACT YOUR SURGEON.   °                                                °FOLLOW-UP APPOINTMENTS:  If you do not already have a post-op appointment, please call the office for an  appointment to be seen by your surgeon.  Guidelines for how soon to be seen are listed in your “After Visit Summary”, but are typically between 1-4 weeks after surgery. ° °OTHER INSTRUCTIONS:  ° °Knee Replacement:  Do not place pillow under knee, focus on keeping the knee straight while resting.  ° °MAKE SURE YOU:  °• Understand these instructions.  °• Get help right away if you are not doing well or get worse.  ° ° °Thank you for letting us be a part of your medical care team.  It is a privilege we respect greatly.  We hope these instructions will help you stay on track for a fast and full recovery!  ° °

## 2016-03-25 NOTE — Anesthesia Postprocedure Evaluation (Signed)
Anesthesia Post Note  Patient: Jodi Gutierrez  Procedure(s) Performed: Procedure(s) (LRB): LEFT TOTAL KNEE ARTHROPLASTY (Left)  Patient location during evaluation: PACU Anesthesia Type: Spinal, MAC and Regional Level of consciousness: awake and alert Pain management: pain level controlled Vital Signs Assessment: post-procedure vital signs reviewed and stable Respiratory status: spontaneous breathing and respiratory function stable Cardiovascular status: blood pressure returned to baseline and stable Postop Assessment: spinal receding Anesthetic complications: no    Last Vitals:  Vitals:   03/25/16 1145 03/25/16 1242  BP: 131/73 135/66  Pulse: 100 92  Resp: 16 16  Temp: 36.4 C 36.4 C    Last Pain:  Vitals:   03/25/16 1242  TempSrc: Oral  PainSc:                  Guerline Happ,W. EDMOND

## 2016-03-25 NOTE — Brief Op Note (Signed)
03/25/2016  9:23 AM  PATIENT:  Jodi Gutierrez  65 y.o. female  PRE-OPERATIVE DIAGNOSIS:  LEFT KNEE OA  POST-OPERATIVE DIAGNOSIS:  LEFT KNEE OA  PROCEDURE:  Procedure(s) with comments: LEFT TOTAL KNEE ARTHROPLASTY (Left) - Adductor Block  SURGEON:  Surgeon(s) and Role:    * Paralee Cancel, MD - Primary  PHYSICIAN ASSISTANT: Molli Barrows, PA-C  ANESTHESIA:   regional and spinal  EBL:  Total I/O In: 2000 [I.V.:2000] Out: 275 [Urine:175; Blood:100]  BLOOD ADMINISTERED:none  DRAINS: none   LOCAL MEDICATIONS USED:  MARCAINE     SPECIMEN:  No Specimen  DISPOSITION OF SPECIMEN:  N/A  COUNTS:  YES  TOURNIQUET:   Total Tourniquet Time Documented: Thigh (Left) - 49 minutes Total: Thigh (Left) - 49 minutes   DICTATION: .Other Dictation: Dictation Number [id number]  PLAN OF CARE: Admit to inpatient   PATIENT DISPOSITION:  PACU - hemodynamically stable.   Delay start of Pharmacological VTE agent (>24hrs) due to surgical blood loss or risk of bleeding: no

## 2016-03-25 NOTE — Addendum Note (Signed)
Addendum  created 03/25/16 1301 by Roderic Palau, MD   Sign clinical note

## 2016-03-26 LAB — CBC
HCT: 29.5 % — ABNORMAL LOW (ref 36.0–46.0)
Hemoglobin: 10.2 g/dL — ABNORMAL LOW (ref 12.0–15.0)
MCH: 31.5 pg (ref 26.0–34.0)
MCHC: 34.6 g/dL (ref 30.0–36.0)
MCV: 91 fL (ref 78.0–100.0)
PLATELETS: 253 10*3/uL (ref 150–400)
RBC: 3.24 MIL/uL — AB (ref 3.87–5.11)
RDW: 12.5 % (ref 11.5–15.5)
WBC: 10.2 10*3/uL (ref 4.0–10.5)

## 2016-03-26 LAB — BASIC METABOLIC PANEL
ANION GAP: 4 — AB (ref 5–15)
BUN: 12 mg/dL (ref 6–20)
CALCIUM: 8.4 mg/dL — AB (ref 8.9–10.3)
CO2: 26 mmol/L (ref 22–32)
Chloride: 109 mmol/L (ref 101–111)
Creatinine, Ser: 0.78 mg/dL (ref 0.44–1.00)
GFR calc Af Amer: >60 mL/min (ref >60)
GLUCOSE: 127 mg/dL — AB (ref 65–99)
POTASSIUM: 3.8 mmol/L (ref 3.5–5.1)
SODIUM: 139 mmol/L (ref 135–145)

## 2016-03-26 MED ORDER — ACETAMINOPHEN 500 MG PO TABS: 1000.0000 mg | ORAL_TABLET | Freq: Three times a day (TID) | ORAL | 0 refills | Status: AC

## 2016-03-26 MED ORDER — RIVAROXABAN 10 MG PO TABS: 10.0000 mg | ORAL_TABLET | ORAL | 0 refills | Status: DC

## 2016-03-26 MED ORDER — DOCUSATE SODIUM 100 MG PO CAPS: 100.0000 mg | ORAL_CAPSULE | Freq: Two times a day (BID) | ORAL | 0 refills | Status: DC

## 2016-03-26 MED ORDER — OXYCODONE HCL 5 MG PO TABS: 5.0000 mg | ORAL_TABLET | ORAL | 0 refills | Status: DC

## 2016-03-26 MED ORDER — METHOCARBAMOL 500 MG PO TABS: 500.0000 mg | ORAL_TABLET | Freq: Four times a day (QID) | ORAL | 0 refills | Status: DC | PRN

## 2016-03-26 MED ORDER — FERROUS SULFATE 325 (65 FE) MG PO TABS: 325.0000 mg | ORAL_TABLET | Freq: Three times a day (TID) | ORAL | 3 refills | Status: DC

## 2016-03-26 MED ORDER — POLYETHYLENE GLYCOL 3350 17 G PO PACK: 17.0000 g | PACK | Freq: Two times a day (BID) | ORAL | 0 refills | Status: DC

## 2016-03-26 NOTE — Care Management Note (Signed)
Case Management Note  Patient Details  Name: RENNEE LIENHART MRN: [id number] Date of Birth: 1951/04/17  Subjective/Objective:                  LEFT TOTAL KNEE ARTHROPLASTY (Left)  Action/Plan: Discharge planning  Expected Discharge Date:  03/26/16               Expected Discharge Plan:  [place]  In-House Referral:     Discharge planning Services  CM Consult  Post Acute Care Choice:  Home Health Choice offered to:  Patient, Spouse  DME Arranged:  N/A DME Agency:  NA  HH Arranged:  PT [place] Agency:  [place]  Status of Service:  Completed, signed off  If discussed at [place] of Stay Meetings, dates discussed:    Additional Comments: CM spoke with pt's spouse (by phone) to offer choice as pt has already discharged.  Family chooses [facility] to render HHPT. Referral called to [facility] rep, Manuela Schwartz.  Pt has both a rolling walker and 3n1 at home.  NO other CM needs were communicated. Dellie Catholic, RN 03/26/2016, 11:43 AM

## 2016-03-26 NOTE — Progress Notes (Signed)
     Subjective: 1 Day Post-Op Procedure(s) (LRB): LEFT TOTAL KNEE ARTHROPLASTY (Left)   Patient reports pain as mild, pain controlled. No events throughout the night.  Already feels that she is doing well with motion of the left knee.  She is concerned that after she went home after the last knee surgery she had a horrible fibromyalgia attack.  We have already sent in prednisone to her pharmacy for her to use after surgery to try to help prevent another attack.  Ready to be discharged home.   Objective:   VITALS:   Vitals:   03/26/16 0205 03/26/16 0522  BP: (!) 115/55 (!) 118/57  Pulse: 87 83  Resp: 18 18  Temp: 97.9 F (36.6 C) 98.2 F (36.8 C)    Dorsiflexion/Plantar flexion intact Incision: dressing C/D/I No cellulitis present Compartment soft  LABS  Recent Labs  03/26/16 0441  HGB 10.2*  HCT 29.5*  WBC 10.2  PLT 253     Recent Labs  03/26/16 0441  NA 139  K 3.8  BUN 12  CREATININE 0.78  GLUCOSE 127*     Assessment/Plan: 1 Day Post-Op Procedure(s) (LRB): LEFT TOTAL KNEE ARTHROPLASTY (Left) Foley cath d/c'ed Advance diet Up with therapy D/C IV fluids Discharge home with home health  Follow up in 2 weeks at [facility]. Follow up with OLIN,Armend Hochstatter D in 2 weeks.  Contact information:  [facility] [address], [address] [place] Prathik Aman   PAC  03/26/2016, 7:47 AM

## 2016-03-26 NOTE — Op Note (Signed)
NAMELEORIA, PEGLOW                 ACCOUNT NO.:  [id number]  MEDICAL RECORD NO.:  [id number]  LOCATION:  31                         FACILITY:  [facility]  PHYSICIAN:  Pietro Cassis. Alvan Dame, M.D.  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  PROCEDURE:  Left total knee arthroplasty.  COMPONENTS USED:  [employer] nickel-free component system with a size 5 left posterior stabilized Oxinium femoral component, size 4 left nonporous tibial base plate, a size 13 mm insert to match the size 4 tibial base plate, and a 35 patellar button.  SURGEON:  Pietro Cassis. Alvan Dame, M.D.  ASSISTANT:  Judith Part. Chabon, PA-C.  ANESTHESIA:  Preoperative adductor canal block plus spinal anesthetic.  COMPLICATIONS:  None.  DRAINS:  None.  TOURNIQUET TIME:  49 minutes at 250 mmHg.  BLOOD LOSS:  Less than 100 mL.  INDICATIONS FOR PROCEDURE:  Ms. Koralewski is a 65 year old patient of mine with previous right total knee arthroplasty.  She was noted to have advanced left knee osteoarthritis, probably affecting the medial and patellofemoral compartments.  She had failed conservative measures and she very much remembered the postoperative period and the challenges that she had with that, she wished to proceed with a left total knee arthroplasty.  We discussed what we felt to help get through the course as much as possible in consideration of her fibromyalgia and medication use as well as the concerns about the nickel allergy.  After reviewing this at length, risks of infection, DVT, component failure, need for future surgery were all discussed  and reviewed. Consent was obtained for benefit of pain relief and improved function.  PROCEDURE IN DETAIL:  The patient was brought to the operative theater. Once adequate anesthesia, preoperative antibiotics, clindamycin administered due to penicillin allergy as well as concern for cephalosporin allergy in addition to it, she was given 1 g of tranexamic acid, 10 mg of Decadron, IV Tylenol, and gabapentin.  This was all in an attempt to try to reduce her postoperative neurologic symptoms and fibromyalgia flare-up.  She was positioned supine.  Left thigh tourniquet was placed.  Left lower extremity was then prepped and draped in sterile fashion.  Time-out was performed identifying the patient, planned procedure, and extremity.  Left lower extremity was then placed in [facility] leg holder.  Leg exsanguinated and tourniquet elevated to 250 mmHg.  A midline incision was made followed by soft tissue dissection.  Median arthrotomy was then made encountering clear synovial fluid.  Following initial synovectomy and exposure of the knee, attention was first directed to patella.  Precut measurement was only noted to be 21 mm.  I resected down to 14 mm.  I used a 35 patellar button restoring height and matching the contralateral knee.  At this point, retractors were placed and attention was directed to the femur.  Femoral  canal was opened with a drill, irrigated to try to prevent fat emboli.  Intramedullary rod was passed and at 5 degrees of valgus, 9 mm of bone resected off the distal femur.  Following this resection, the tibia was subluxated anteriorly and resected what amounted to be 8 mm of bone measured off the medial side of the tibia.  Once this was done, we confirmed the gap in the knee was going to be at least 9 or 11 mm insert, but was perpendicular in the coronal plane.  I then sized the femur to be a size 6.  Initially, I placed a size 6 cutting block, which was a posterior  reference system.  The anterior, posterior, and chamfer cuts were then made.  We then created the box cut for the posterior stabilized insert.  At this point, I determined that I was a little bit worried about the medial and lateral dimension, the size 6, we would use a size 5 on the contralateral knee and I remember this decision-making process at that time.  On the left, we went ahead and finished off the tibial preparation, drilling and keel punching for size 4 tibial tray.  I did a trial reduction.  At this point, found that with the 11 or 13 mm insert, the knee came to full extension, with 11 slight hyperextension.  The patella tracked through the trochlea without application of pressure.  At this point, I removed the trial components.  I did make a decision to downsize to 5 to prevent any medial and lateral overhang.  The size 5 cutting block was pinned into position.  I removed anterior bone feathering it into the anterior cortex to prevent any excessive notching concern.  The flexion gap remained stable.  At this point, the final components were opened, cement was mixed, the final components were then cemented into place and excessive cement was removed throughout the knee.  The knee was brought to extension with a 13 mm insert.  It was noted to be full extension.  Once the cement had fully cured, excessive cement was removed throughout the knee, the final size 13 insert for the size 4 base plate was opened and impacted into the locking mechanism of the tibial base plate.  The knee was irrigated throughout the case again at this point.  Hemostasis was obtained after tourniquet was let down at 49 minutes.  The knee was irrigated again. The extensor mechanism was then reapproximated using a #1 Vicryl and 0-V- Loc suture.  The remainder of the wound was closed with 2-0 Vicryl and 4- 0 running Monocryl.  The knee was cleaned, dried, and dressed sterilely using surgical glue  and Aquacel dressing.  She was brought to the recovery room in stable condition tolerating the procedure well. Findings were reviewed with the family.  Postoperatively, we will plan on her using a prednisone Dosepak for 12 days best she can remember that helped her with her fibromyalgia as well as prednisone 5 mg for a month. We discussed the concerns for infection, but given her fibromyalgia pain, she wished to proceed in this fashion.     Pietro Cassis Alvan Dame, M.D.     MDO/MEDQ  D:  03/25/2016  T:  03/26/2016  Job:  [id number]

## 2016-03-26 NOTE — Progress Notes (Signed)
OT Cancellation Note  Patient Details Name: CESLIE STARCK MRN: [id number] DOB: September 03, 1950   Cancelled Treatment:    Reason Eval/Treat Not Completed: PT screened, no needs identified, will sign off.  Pt recently had TKA.  Stepan Verrette 03/26/2016, 7:41 AM  Lesle Chris, OTR/L [phone] 03/26/2016

## 2016-03-26 NOTE — Progress Notes (Signed)
Physical Therapy Treatment Patient Details Name: VARONICA BRADBURN MRN: [id number] DOB: 23-Oct-1950 Today's Date: 03/26/2016    History of Present Illness L TKA, adductor block    PT Comments    POD # 1 pm session Assisted with amb again and practiced one step using walker. Performed all sitting TE's following HEP and instructed on proper tech and freq as well as use of ICE.  Pt ready for D/C to home.    Follow Up Recommendations  Home health PT;Supervision - Intermittent     Equipment Recommendations  None recommended by PT    Recommendations for Other Services       Precautions / Restrictions Precautions Precautions: Fall;Knee Restrictions Weight Bearing Restrictions: No LLE Weight Bearing: Weight bearing as tolerated    Mobility  Bed Mobility               General bed mobility comments: NT OOB in recliner  Transfers Overall transfer level: Modified independent Equipment used: None Transfers: Sit to/from Stand Sit to Stand: Modified independent (Device/Increase time)         General transfer comment: one VC to avoid pulling up on walker otherwise good safety tech   Ambulation/Gait Ambulation/Gait assistance: Supervision Ambulation Distance (Feet): 185 Feet   Gait Pattern/deviations: Step-through pattern Gait velocity: WFL   General Gait Details: not antalgic, smoothe gait   Stairs Stairs: Yes Stairs assistance: Supervision Stair Management: No rails;Step to pattern;Forwards;With walker Number of Stairs: 1 General stair comments: only one initial VC needed on proper walker placement and safety   Wheelchair Mobility    Modified Rankin (Stroke Patients Only)       Balance                                    Cognition Arousal/Alertness: Awake/alert Behavior During Therapy: WFL for tasks assessed/performed Overall Cognitive Status: Within Functional Limits for tasks assessed                      Exercises  10 reps  L knee bends in sitting  10 reps L assisted knee bends in sitting with instruction on proper L LE alignment  10 reps L LAQ's in sitting    General Comments        Pertinent Vitals/Pain Pain Assessment: 0-10 Pain Score: 3 with TE's Pain Location: L knee Pain Descriptors / Indicators: Sore Pain Intervention(s): Monitored during session;Repositioned;Ice applied    Home Living                      Prior Function            PT Goals (current goals can now be found in the care plan section) Progress towards PT goals: Progressing toward goals    Frequency    7X/week      PT Plan Current plan remains appropriate    Co-evaluation             End of Session Equipment Utilized During Treatment: Gait belt Activity Tolerance: Patient tolerated treatment well Patient left: in chair;with call bell/phone within reach     Time: [id number] PT Time Calculation (min) (ACUTE ONLY): 16 min  Charges:  $Gait Training: 8-22 mins                    G Codes:     Rica Koyanagi  PTA [facility]  Acute  Rehab Pager      [phone]

## 2016-03-26 NOTE — Progress Notes (Signed)
Physical Therapy Treatment Patient Details Name: Jodi Gutierrez MRN: [id number] DOB: 03/04/1951 Today's Date: 03/26/2016    History of Present Illness L TKA, adductor block    PT Comments    POD # 1 am session Assisted with amb in hallway then to bathroom.  Performed all supine TKR TE's following HEP.  Instructed on proper tech and freq as well as use of ICE.   Follow Up Recommendations  Home health PT;Supervision - Intermittent     Equipment Recommendations  None recommended by PT    Recommendations for Other Services       Precautions / Restrictions Precautions Precautions: Fall;Knee Restrictions Weight Bearing Restrictions: No LLE Weight Bearing: Weight bearing as tolerated    Mobility  Bed Mobility               General bed mobility comments: NT OOB in recliner  Transfers Overall transfer level: Modified independent Equipment used: None Transfers: Sit to/from Stand Sit to Stand: Modified independent (Device/Increase time)         General transfer comment: one VC to avoid pulling up on walker otherwise good safety tech   Ambulation/Gait Ambulation/Gait assistance: Supervision Ambulation Distance (Feet): 185 Feet   Gait Pattern/deviations: Step-through pattern Gait velocity: WFL   General Gait Details: not antalgic, smoothe gait   Stairs            Wheelchair Mobility    Modified Rankin (Stroke Patients Only)       Balance                                    Cognition Arousal/Alertness: Awake/alert Behavior During Therapy: WFL for tasks assessed/performed Overall Cognitive Status: Within Functional Limits for tasks assessed                      Exercises   Total Knee Replacement TE's 10 reps B LE ankle pumps 10 reps towel squeezes 10 reps knee presses 10 reps heel slides  10 reps SAQ's 10 reps SLR's 10 reps ABD Followed by ICE     General Comments        Pertinent Vitals/Pain Pain Assessment:  0-10 Pain Score: 1  Pain Location: L knee Pain Descriptors / Indicators: Sore Pain Intervention(s): Monitored during session;Repositioned;Ice applied    Home Living                      Prior Function            PT Goals (current goals can now be found in the care plan section) Progress towards PT goals: Progressing toward goals    Frequency    7X/week      PT Plan Current plan remains appropriate    Co-evaluation             End of Session Equipment Utilized During Treatment: Gait belt Activity Tolerance: Patient tolerated treatment well Patient left: in chair;with call bell/phone within reach     Time: 0925-0950 PT Time Calculation (min) (ACUTE ONLY): 25 min  Charges:  $Gait Training: 8-22 mins $Therapeutic Exercise: 8-22 mins                    G Codes:      Rica Koyanagi  PTA [facility]  Acute  Rehab Pager      [phone]

## 2016-04-03 NOTE — Discharge Summary (Signed)
Physician Discharge Summary  Patient ID: Jodi Gutierrez MRN: [id number] DOB/AGE: [birth year] 67 y.o.  Admit date: 03/25/2016 Discharge date: 03/26/2016   Procedures:  Procedure(s) (LRB): LEFT TOTAL KNEE ARTHROPLASTY (Left)  Attending Physician:  Dr. Paralee Cancel   Admission Diagnoses:   Left knee primary OA / pain  Discharge Diagnoses:  Active Problems:   S/P right TKA  Past Medical History:  Diagnosis Date  . Anemia    hx of as child and early adult   . Arthritis   . Claustrophobia   . Fibromyalgia   . H/O fibromyalgia     HPI:    Jodi Gutierrez, 65 y.o. female, has a history of pain and functional disability in the left knee due to arthritis and has failed non-surgical conservative treatments for greater than 12 weeks to include NSAID's and/or analgesics, corticosteriod injections and activity modification.  Onset of symptoms was gradual, starting years ago with gradually worsening course since that time. The patient noted prior procedures on the knee to include  arthroscopy on the left knee(s).  Patient currently rates pain in the left knee(s) at 8 out of 10 with activity. Patient has night pain, worsening of pain with activity and weight bearing, pain that interferes with activities of daily living, pain with passive range of motion, crepitus and joint swelling.  Patient has evidence of periarticular osteophytes and joint space narrowing by imaging studies.  There is no active infection.  Risks, benefits and expectations were discussed with the patient.  Risks including but not limited to the risk of anesthesia, blood clots, nerve damage, blood vessel damage, failure of the prosthesis, infection and up to and including death.  Patient understand the risks, benefits and expectations and wishes to proceed with surgery.   PCP: Thressa Sheller, MD   Discharged Condition: good  Hospital Course:  Patient underwent the above stated procedure on 03/25/2016. Patient tolerated the  procedure well and brought to the recovery room in good condition and subsequently to the floor.  POD #1 BP: 118/57 ; Pulse: 83 ; Temp: 98.2 F (36.8 C) ; Resp: 18 Patient reports pain as mild, pain controlled. No events throughout the night.  Already feels that she is doing well with motion of the left knee.  She is concerned that after she went home after the last knee surgery she had a horrible fibromyalgia attack.  We have already sent in prednisone to her pharmacy for her to use after surgery to try to help prevent another attack.  Ready to be discharged home. Dorsiflexion/plantar flexion intact, incision: dressing C/D/I, no cellulitis present and compartment soft.   LABS  Basename    HGB     10.2  HCT     29.5    Discharge Exam: General appearance: alert, cooperative and no distress Extremities: Homans sign is negative, no sign of DVT, no edema, redness or tenderness in the calves or thighs and no ulcers, gangrene or trophic changes  Disposition: Home with follow up in 2 weeks   Follow-up Information    Mauri Pole, MD. Schedule an appointment as soon as possible for a visit in 2 week(s).   Specialty:  Orthopedic Surgery Contact information: [address] [place] [address] [phone]        [facility] .   Why:  home health physical therapy Contact information: [address] [place] [address] [phone]           Discharge Instructions  Call MD / Call 911    Complete by:  As directed    If you experience chest pain or shortness of breath, CALL 911 and be transported to the hospital emergency room.  If you develope a fever above 101 F, pus (white drainage) or increased drainage or redness at the wound, or calf pain, call your surgeon's office.   Change dressing    Complete by:  As directed    Maintain surgical dressing until follow up in the clinic. If the edges start to pull up, may reinforce with tape.  If the dressing is no longer working, may remove and cover with gauze and tape, but must keep the area dry and clean.  Call with any questions or concerns.   Constipation Prevention    Complete by:  As directed    Drink plenty of fluids.  Prune juice may be helpful.  You may use a stool softener, such as Colace (over the counter) 100 mg twice a day.  Use MiraLax (over the counter) for constipation as needed.   Diet - low sodium heart healthy    Complete by:  As directed    Discharge instructions    Complete by:  As directed    Maintain surgical dressing for 3-4 day then replace daily with gauze and tape.  Must keep the area dry and clean.  Follow up in 2 weeks at [facility]. Call with any questions or concerns.   Increase activity slowly as tolerated    Complete by:  As directed    Weight bearing as tolerated with assist device (walker, cane, etc) as directed, use it as long as suggested by your surgeon or therapist, typically at least 4-6 weeks.   TED hose    Complete by:  As directed    Use stockings (TED hose) for 2 weeks on both leg(s).  You may remove them at night for sleeping.        Medication List    STOP taking these medications   estradiol 0.5 MG tablet Commonly known as:  ESTRACE   ibuprofen 200 MG tablet Commonly known as:  ADVIL,MOTRIN   traMADol 50 MG tablet Commonly known as:  ULTRAM     TAKE these medications   acetaminophen 500 MG tablet Commonly known as:  TYLENOL Take 2 tablets (1,000 mg total) by mouth every 8 (eight) hours.   amitriptyline 25 MG tablet Commonly known as:  ELAVIL Take 25 mg by mouth at bedtime.   calcium carbonate 1500 (600 Ca) MG Tabs tablet Commonly known as:  OSCAL Take 600 mg by mouth 2 (two) times daily with a meal.   diazepam 2 MG tablet Commonly known as:  VALIUM Take 2 mg by mouth at bedtime as needed for sedation.   docusate sodium 100 MG capsule Commonly known as:  COLACE Take 1 capsule (100 mg total) by  mouth 2 (two) times daily.   ferrous sulfate 325 (65 FE) MG tablet Take 1 tablet (325 mg total) by mouth 3 (three) times daily after meals.   methocarbamol 500 MG tablet Commonly known as:  ROBAXIN Take 1 tablet (500 mg total) by mouth every 6 (six) hours as needed for muscle spasms.   multivitamin tablet Take 1 tablet by mouth every morning.   oxyCODONE 5 MG immediate release tablet Commonly known as:  Oxy IR/ROXICODONE Take 1-3 tablets (5-15 mg total) by mouth every 4 (four) hours.   polyethylene glycol packet Commonly known as:  MIRALAX / GLYCOLAX  Take 17 g by mouth 2 (two) times daily.   psyllium 58.6 % packet Commonly known as:  METAMUCIL Take 1 packet by mouth daily.   rivaroxaban 10 MG Tabs tablet Commonly known as:  XARELTO Take 1 tablet (10 mg total) by mouth daily.   VITAMIN A PO Take 1 capsule by mouth daily.   Vitamin D3 5000 units Tbdp Take 5,000 Units by mouth 2 (two) times daily.        Signed: [place] Pugh. Zhyon Antenucci   PA-C  04/03/2016, 9:36 AM

## 2017-07-16 ENCOUNTER — Encounter: Payer: Self-pay | Admitting: Gastroenterology

## 2017-09-02 ENCOUNTER — Encounter: Payer: Self-pay | Admitting: Gastroenterology

## 2017-09-29 ENCOUNTER — Other Ambulatory Visit: Payer: Self-pay | Admitting: *Deleted

## 2017-10-13 ENCOUNTER — Ambulatory Visit (INDEPENDENT_AMBULATORY_CARE_PROVIDER_SITE_OTHER): Payer: 59

## 2017-10-13 ENCOUNTER — Ambulatory Visit (HOSPITAL_COMMUNITY)
Admission: EM | Admit: 2017-10-13 | Discharge: 2017-10-13 | Disposition: A | Discharge status: Home / Self Care | Payer: 59 | Attending: Internal Medicine | Admitting: Internal Medicine

## 2017-10-13 ENCOUNTER — Encounter (HOSPITAL_COMMUNITY): Payer: Self-pay | Admitting: Emergency Medicine

## 2017-10-13 DIAGNOSIS — M5030 Other cervical disc degeneration, unspecified cervical region: Secondary | ICD-10-CM

## 2017-10-13 DIAGNOSIS — S161XXA Strain of muscle, fascia and tendon at neck level, initial encounter: Secondary | ICD-10-CM

## 2017-10-13 DIAGNOSIS — M542 Cervicalgia: Secondary | ICD-10-CM | POA: Diagnosis not present

## 2017-10-13 DIAGNOSIS — R51 Headache: Secondary | ICD-10-CM | POA: Diagnosis not present

## 2017-10-13 MED ORDER — NAPROXEN 375 MG PO TABS: 375.0000 mg | ORAL_TABLET | Freq: Two times a day (BID) | ORAL | 0 refills | Status: DC

## 2017-10-13 MED ORDER — METHOCARBAMOL 500 MG PO TABS: 500.0000 mg | ORAL_TABLET | Freq: Four times a day (QID) | ORAL | 0 refills | Status: DC | PRN
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
## 2017-10-13 NOTE — Discharge Instructions (Addendum)
Xray does not show an acute fracture tonight but does show degenerative disc disease. A CT is more sensitive for fractures. However, you do not have focal tenderness and pain has been worsening which appears much more likely to be muscular in nature. We will start muscle relaxers as well as NSAID- naproxen twice a day- take with food.  See neck exercises.  Continue to follow with your primary care provider as needed and/or your orthopedist.

## 2017-10-15 ENCOUNTER — Ambulatory Visit (AMBULATORY_SURGERY_CENTER): Payer: Self-pay | Admitting: *Deleted

## 2017-10-15 ENCOUNTER — Other Ambulatory Visit: Payer: Self-pay

## 2017-10-15 VITALS — Ht 70.0 in | Wt 148.2 lb

## 2017-10-15 DIAGNOSIS — Z800 Family history of malignant neoplasm of digestive organs: Secondary | ICD-10-CM

## 2017-10-15 MED ORDER — NA SULFATE-K SULFATE-MG SULF 17.5-3.13-1.6 GM/177ML PO SOLN: 1.0000 | Freq: Once | ORAL | 0 refills | Status: AC

## 2017-10-15 NOTE — Progress Notes (Signed)
No egg or soy allergy known to patient - soy causes diarrhea  No issues with past sedation with any surgeries  or procedures, no intubation problems  No diet pills per patient No home 02 use per patient  No blood thinners per patient  Pt denies issues with constipation - pt takes Metamucil daily  No A fib or A flutter  EMMI video sent to pt's e mail - pt declined  Pt given a $15 coupon for Suprep in [place] today

## 2017-10-27 ENCOUNTER — Other Ambulatory Visit: Payer: Self-pay

## 2017-10-27 ENCOUNTER — Encounter: Payer: Self-pay | Admitting: Gastroenterology

## 2017-10-27 ENCOUNTER — Other Ambulatory Visit (INDEPENDENT_AMBULATORY_CARE_PROVIDER_SITE_OTHER): Payer: 59

## 2017-10-27 ENCOUNTER — Ambulatory Visit (AMBULATORY_SURGERY_CENTER): Payer: 59 | Admitting: Gastroenterology

## 2017-10-27 VITALS — BP 116/69 | HR 78 | Temp 97.3°F | Resp 10 | Ht 71.0 in | Wt 144.0 lb

## 2017-10-27 DIAGNOSIS — Z800 Family history of malignant neoplasm of digestive organs: Secondary | ICD-10-CM | POA: Diagnosis not present

## 2017-10-27 DIAGNOSIS — K635 Polyp of colon: Secondary | ICD-10-CM

## 2017-10-27 DIAGNOSIS — Z1211 Encounter for screening for malignant neoplasm of colon: Secondary | ICD-10-CM

## 2017-10-27 DIAGNOSIS — D123 Benign neoplasm of transverse colon: Secondary | ICD-10-CM | POA: Diagnosis not present

## 2017-10-27 LAB — BUN: BUN: 9 mg/dL (ref 6–23)

## 2017-10-27 MED ORDER — SODIUM CHLORIDE 0.9 % IV SOLN: 500.0000 mL | Freq: Once | INTRAVENOUS | Status: AC

## 2017-10-27 NOTE — Op Note (Signed)
[facility] Patient Name: Jodi Gutierrez Procedure Date: 10/27/2017 2:05 PM MRN: [id number] Endoscopist: Mauri Pole , MD Age: 67 Referring MD:  Date of Birth: 1950/10/12 Gender: Female Account #: [id number] Procedure:                Colonoscopy Indications:              Screening for colorectal malignant neoplasm Medicines:                Monitored Anesthesia Care Procedure:                Pre-Anesthesia Assessment:                           - Prior to the procedure, a History and Physical                            was performed, and patient medications and                            allergies were reviewed. The patient's tolerance of                            previous anesthesia was also reviewed. The risks                            and benefits of the procedure and the sedation                            options and risks were discussed with the patient.                            All questions were answered, and informed consent                            was obtained. Prior Anticoagulants: The patient has                            taken no previous anticoagulant or antiplatelet                            agents. ASA Grade Assessment: II - A patient with                            mild systemic disease. After reviewing the risks                            and benefits, the patient was deemed in                            satisfactory condition to undergo the procedure.                           After obtaining informed consent, the colonoscope  was passed under direct vision. Throughout the                            procedure, the patient's blood pressure, pulse, and                            oxygen saturations were monitored continuously. The                            Colonoscope was introduced through the anus and                            advanced to the the hepatic flexure. The Endoscope                            was introduced  through the and advanced to the. The                            patient tolerated the procedure well. The quality                            of the bowel preparation was excellent. The                            ileocecal valve, appendiceal orifice, and rectum                            were photographed. The colonoscopy was extremely                            difficult due to multiple diverticula in the colon,                            restricted mobility of the colon and a tortuous                            colon. Successful completion of the procedure was                            aided by withdrawing the scope and replacing with                            the adult endoscope. Scope In: 2:52:26 PM Scope Out: 2:58:35 PM Total Procedure Duration: 0 hours 6 minutes 9 seconds  Findings:                 The perianal and digital rectal examinations were                            normal.                           A 7 mm polyp was found in the transverse colon. The  polyp was sessile. The polyp was removed with a                            cold snare. Resection and retrieval were complete.                           Non-bleeding internal hemorrhoids were found during                            retroflexion. The hemorrhoids were small. Complications:            No immediate complications. Estimated Blood Loss:     Estimated blood loss was minimal. Impression:               - One 7 mm polyp in the transverse colon, removed                            with a cold snare. Resected and retrieved.                           - Non-bleeding internal hemorrhoids. Recommendation:           - Patient has a contact number available for                            emergencies. The signs and symptoms of potential                            delayed complications were discussed with the                            patient. Return to normal activities tomorrow.                             Written discharge instructions were provided to the                            patient.                           - Resume previous diet.                           - Continue present medications.                           - Await pathology results.                           - Virtual colonoscopy (CT colography) Mauri Pole, MD 10/27/2017 3:09:10 PM This report has been signed electronically.

## 2017-10-27 NOTE — Progress Notes (Signed)
Report given to PACU, vss 

## 2017-10-27 NOTE — Progress Notes (Signed)
Called to room to assist during endoscopic procedure.  Patient ID and intended procedure confirmed with present staff. Received instructions for my participation in the procedure from the performing physician.  

## 2017-10-27 NOTE — Patient Instructions (Signed)
Information on polyps. Patient going to lab for BUN. Virtual colonoscopy to be scheduled by [place] office.   YOU HAD AN ENDOSCOPIC PROCEDURE TODAY AT [facility]:   Refer to the procedure report that was given to you for any specific questions about what was found during the examination.  If the procedure report does not answer your questions, please call your gastroenterologist to clarify.  If you requested that your care partner not be given the details of your procedure findings, then the procedure report has been included in a sealed envelope for you to review at your convenience later.  YOU SHOULD EXPECT: Some feelings of bloating in the abdomen. Passage of more gas than usual.  Walking can help get rid of the air that was put into your GI tract during the procedure and reduce the bloating. If you had a lower endoscopy (such as a colonoscopy or flexible sigmoidoscopy) you may notice spotting of blood in your stool or on the toilet paper. If you underwent a bowel prep for your procedure, you may not have a normal bowel movement for a few days.  Please Note:  You might notice some irritation and congestion in your nose or some drainage.  This is from the oxygen used during your procedure.  There is no need for concern and it should clear up in a day or so.  SYMPTOMS TO REPORT IMMEDIATELY:   Following lower endoscopy (colonoscopy or flexible sigmoidoscopy):  Excessive amounts of blood in the stool  Significant tenderness or worsening of abdominal pains  Swelling of the abdomen that is new, acute  Fever of 100F or higher   For urgent or emergent issues, a gastroenterologist can be reached at any hour by calling [phone].   DIET:  We do recommend a small meal at first, but then you may proceed to your regular diet.  Drink plenty of fluids but you should avoid alcoholic beverages for 24 hours.  ACTIVITY:  You should plan to take it easy for the rest of today and you  should NOT DRIVE or use heavy machinery until tomorrow (because of the sedation medicines used during the test).    FOLLOW UP: Our staff will call the number listed on your records the next business day following your procedure to check on you and address any questions or concerns that you may have regarding the information given to you following your procedure. If we do not reach you, we will leave a message.  However, if you are feeling well and you are not experiencing any problems, there is no need to return our call.  We will assume that you have returned to your regular daily activities without incident.  If any biopsies were taken you will be contacted by phone or by letter within the next 1-3 weeks.  Please call us at [phone] if you have not heard about the biopsies in 3 weeks.    SIGNATURES/CONFIDENTIALITY: You and/or your care partner have signed paperwork which will be entered into your electronic medical record.  These signatures attest to the fact that that the information above on your After Visit Summary has been reviewed and is understood.  Full responsibility of the confidentiality of this discharge information lies with you and/or your care-partner.

## 2017-10-27 NOTE — Progress Notes (Signed)
Pt's states no medical or surgical changes since previsit or office visit. 

## 2017-10-28 ENCOUNTER — Telehealth: Payer: Self-pay | Admitting: *Deleted

## 2017-10-28 NOTE — Telephone Encounter (Signed)
  Follow up Call-  Call back number 10/27/2017  Post procedure Call Back phone  # [phone]  Permission to leave phone message Yes  Some recent data might be hidden     Patient questions:  Do you have a fever, pain , or abdominal swelling? No. Pain Score  0 *  Have you tolerated food without any problems? Yes.    Have you been able to return to your normal activities? Yes.    Do you have any questions about your discharge instructions: Diet   No. Medications  No. Follow up visit  No.  Do you have questions or concerns about your Care? No.  Actions: * If pain score is 4 or above: No action needed, pain <4.

## 2017-11-03 ENCOUNTER — Telehealth: Payer: Self-pay | Admitting: Gastroenterology

## 2017-11-03 NOTE — Telephone Encounter (Signed)
Spoke with [employer]. They will call her today. Patient notified.

## 2017-11-04 ENCOUNTER — Encounter: Payer: Self-pay | Admitting: Gastroenterology

## 2017-11-24 ENCOUNTER — Ambulatory Visit
Admission: RE | Admit: 2017-11-24 | Discharge: 2017-11-24 | Disposition: A | Discharge status: Home / Self Care | Payer: 59 | Source: Ambulatory Visit | Attending: Gastroenterology | Admitting: Gastroenterology

## 2017-11-24 ENCOUNTER — Encounter (INDEPENDENT_AMBULATORY_CARE_PROVIDER_SITE_OTHER): Payer: Self-pay

## 2017-11-24 DIAGNOSIS — Z1211 Encounter for screening for malignant neoplasm of colon: Secondary | ICD-10-CM

## 2017-11-24 DIAGNOSIS — K635 Polyp of colon: Secondary | ICD-10-CM

## 2018-03-02 ENCOUNTER — Other Ambulatory Visit: Payer: Self-pay | Admitting: Radiology

## 2018-03-05 ENCOUNTER — Telehealth: Payer: Self-pay | Admitting: Hematology and Oncology

## 2018-03-05 NOTE — Telephone Encounter (Signed)
Spoke with patient to confirm morning [facility] appointment for 10/16, [facility] patient no packet sent will send appointment reminder

## 2018-03-09 ENCOUNTER — Encounter: Payer: Self-pay | Admitting: *Deleted

## 2018-03-09 DIAGNOSIS — D0511 Intraductal carcinoma in situ of right breast: Secondary | ICD-10-CM

## 2018-03-11 ENCOUNTER — Inpatient Hospital Stay: Payer: 59

## 2018-03-11 ENCOUNTER — Encounter: Payer: Self-pay | Admitting: *Deleted

## 2018-03-11 ENCOUNTER — Other Ambulatory Visit: Payer: Self-pay | Admitting: *Deleted

## 2018-03-11 ENCOUNTER — Ambulatory Visit: Payer: 59 | Admitting: Physical Therapy

## 2018-03-11 ENCOUNTER — Ambulatory Visit
Admission: RE | Admit: 2018-03-11 | Discharge: 2018-03-11 | Disposition: A | Discharge status: Home / Self Care | Payer: 59 | Source: Ambulatory Visit | Attending: Radiation Oncology | Admitting: Radiation Oncology

## 2018-03-11 ENCOUNTER — Telehealth: Payer: Self-pay | Admitting: Hematology and Oncology

## 2018-03-11 ENCOUNTER — Encounter: Payer: Self-pay | Admitting: Hematology and Oncology

## 2018-03-11 ENCOUNTER — Inpatient Hospital Stay: Payer: 59 | Attending: Hematology and Oncology | Admitting: Hematology and Oncology

## 2018-03-11 DIAGNOSIS — M797 Fibromyalgia: Secondary | ICD-10-CM

## 2018-03-11 DIAGNOSIS — Z800 Family history of malignant neoplasm of digestive organs: Secondary | ICD-10-CM | POA: Insufficient documentation

## 2018-03-11 DIAGNOSIS — Z79810 Long term (current) use of selective estrogen receptor modulators (SERMs): Secondary | ICD-10-CM | POA: Diagnosis not present

## 2018-03-11 DIAGNOSIS — D0511 Intraductal carcinoma in situ of right breast: Secondary | ICD-10-CM

## 2018-03-11 DIAGNOSIS — M1990 Unspecified osteoarthritis, unspecified site: Secondary | ICD-10-CM | POA: Diagnosis not present

## 2018-03-11 DIAGNOSIS — Z79899 Other long term (current) drug therapy: Secondary | ICD-10-CM | POA: Diagnosis not present

## 2018-03-11 DIAGNOSIS — Z170 Estrogen receptor positive status [ER+]: Secondary | ICD-10-CM

## 2018-03-11 LAB — CBC WITH DIFFERENTIAL (CANCER CENTER ONLY)
Abs Immature Granulocytes: 0.01 10*3/uL (ref 0.00–0.07)
BASOS ABS: 0.1 10*3/uL (ref 0.0–0.1)
Basophils Relative: 2 %
EOS ABS: 0.1 10*3/uL (ref 0.0–0.5)
EOS PCT: 3 %
HCT: 38.7 % (ref 36.0–46.0)
Hemoglobin: 13.4 g/dL (ref 12.0–15.0)
IMMATURE GRANULOCYTES: 0 %
Lymphocytes Relative: 24 %
Lymphs Abs: 1.3 10*3/uL (ref 0.7–4.0)
MCH: 32.1 pg (ref 26.0–34.0)
MCHC: 34.6 g/dL (ref 30.0–36.0)
MCV: 92.6 fL (ref 80.0–100.0)
Monocytes Absolute: 0.5 10*3/uL (ref 0.1–1.0)
Monocytes Relative: 9 %
NEUTROS PCT: 62 %
NRBC: 0 % (ref 0.0–0.2)
Neutro Abs: 3.4 10*3/uL (ref 1.7–7.7)
Platelet Count: 245 10*3/uL (ref 150–400)
RBC: 4.18 MIL/uL (ref 3.87–5.11)
RDW: 12.1 % (ref 11.5–15.5)
WBC: 5.5 10*3/uL (ref 4.0–10.5)

## 2018-03-11 LAB — CMP (CANCER CENTER ONLY)
ALK PHOS: 84 U/L (ref 38–126)
ALT: 12 U/L (ref 0–44)
ANION GAP: 12 (ref 5–15)
AST: 17 U/L (ref 15–41)
Albumin: 3.6 g/dL (ref 3.5–5.0)
BILIRUBIN TOTAL: 0.2 mg/dL — AB (ref 0.3–1.2)
BUN: 13 mg/dL (ref 8–23)
CALCIUM: 9.2 mg/dL (ref 8.9–10.3)
CO2: 28 mmol/L (ref 22–32)
CREATININE: 0.82 mg/dL (ref 0.44–1.00)
Chloride: 104 mmol/L (ref 98–111)
Glucose, Bld: 90 mg/dL (ref 70–99)
Potassium: 3.9 mmol/L (ref 3.5–5.1)
SODIUM: 144 mmol/L (ref 135–145)
TOTAL PROTEIN: 6.9 g/dL (ref 6.5–8.1)

## 2018-03-11 NOTE — Progress Notes (Signed)
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  Cancer Staging Ductal carcinoma in situ (DCIS) of right breast Staging form: Breast, AJCC 8th Edition - Clinical stage from 03/11/2018: Stage 0 (cTis (DCIS), cN0, cM0, ER+, PR+, HER2: Not Assessed) - Unsigned    HISTORY OF PRESENT ILLNESS::Jodi Gutierrez is a 67 y.o. female who is presenting to the office today for evaluation of her newly diagnosed breast cancer. She is doing well overall.   She had routine screening mammography on 02/20/18 that showed: The architectural distortion in the right breast is indeterminate.   She underwent unilateral right screening mammography with tomography and right breast ultrasonography at [facility] on 02/26/18 showing: The 2.5 cm architectural distortion in the right breast is indeterminate.  Accordingly on 03/02/18 she proceeded to biopsy of the right breast area in question. The pathology from this procedure showed: Breast, right, needle core biopsy, upper outer quadrant, 1 o'clock, 4cmfn with ductal carcinoma in situ with calcifications and lobular neoplasia (atypical lobular hyperplasia). Prognostic indicators significant for: estrogen receptor, 90% positive and progesterone receptor, 90% positive, both with strong staining intensity.   On review of systems, She reports ringing in ears, breast lump, and claustrophobia. She denies any other symptoms. No family hx of breast cancer.   Menarche: 67 years old Age at first live birth: N/A GP: GxP0 LMP:  Contraceptive: yes HRT: yes   The patient was referred today for  presentation in the multidisciplinary conference.  Radiology studies and pathology slides were presented there for review and discussion of treatment options.  A consensus was discussed regarding potential next steps.  PREVIOUS RADIATION THERAPY: No  PAST MEDICAL HISTORY:  has a past medical history of Anemia, Arthritis, Claustrophobia, Fibromyalgia, and H/O fibromyalgia.    PAST SURGICAL HISTORY: Past Surgical History:  Procedure Laterality Date  . BUNIONECTOMY     bil  . COLONOSCOPY    . FINGER SURGERY     right ring finger /1994  . KNEE ARTHROSCOPY Right   . KNEE SURGERY     left / arthroscopic  . TOTAL KNEE ARTHROPLASTY Right 02/15/2014   Procedure: RIGHT TOTAL KNEE ARTHROPLASTY;  Surgeon: Mauri Pole, MD;  Location: [facility];  Service: Orthopedics;  Laterality: Right;  . TOTAL KNEE ARTHROPLASTY Left 03/25/2016   Procedure: LEFT TOTAL KNEE ARTHROPLASTY;  Surgeon: Paralee Cancel, MD;  Location: [facility];  Service: Orthopedics;  Laterality: Left;  Adductor Block    FAMILY HISTORY: family history includes Colon cancer in her sister; Colon polyps in her brother.  SOCIAL HISTORY:  reports that she has never smoked. She has never used smokeless tobacco. She reports that she drinks about 1.0 standard drinks of alcohol per week. She reports that she does not use drugs.  ALLERGIES: Silver; Tape; Latex; Nickel; and Penicillins  MEDICATIONS:  Current Outpatient Medications  Medication Sig Dispense Refill  . acetaminophen (TYLENOL) 500 MG tablet Take 2 tablets (1,000 mg total) by mouth every 8 (eight) hours. (Patient not taking: Reported on 10/15/2017) 30 tablet 0  . amitriptyline (ELAVIL) 25 MG tablet Take 25 mg by mouth at bedtime.    . calcium carbonate (OSCAL)  1500 (600 Ca) MG TABS tablet Take 600 mg by mouth 2 (two) times daily with a meal.    . Cholecalciferol (VITAMIN D3) 5000 units TBDP Take 10,000 Units by mouth daily.     . diazepam (VALIUM) 2 MG tablet Take 2 mg by mouth at bedtime  as needed for sedation.     . docusate sodium (COLACE) 100 MG capsule Take 1 capsule (100 mg total) by mouth 2 (two) times daily. (Patient not taking: Reported on 10/27/2017) 10 capsule 0  . estradiol (ESTRACE) 1 MG tablet Take 5 mg by mouth daily.    . ferrous sulfate 325 (65 FE) MG tablet Take 1 tablet (325 mg total) by mouth 3 (three) times daily after meals. (Patient not taking: Reported on 10/27/2017)  3  . ibuprofen (ADVIL,MOTRIN) 200 MG tablet Take 200 mg by mouth every 6 (six) hours as needed.    . methocarbamol (ROBAXIN) 500 MG tablet Take 1 tablet (500 mg total) by mouth every 6 (six) hours as needed for muscle spasms. (Patient not taking: Reported on 10/27/2017) 20 tablet 0  . Multiple Vitamin (MULTIVITAMIN) tablet Take 1 tablet by mouth every morning.     . naproxen (NAPROSYN) 375 MG tablet Take 1 tablet (375 mg total) by mouth 2 (two) times daily. (Patient not taking: Reported on 10/27/2017) 20 tablet 0  . oxyCODONE (OXY IR/ROXICODONE) 5 MG immediate release tablet Take 1-3 tablets (5-15 mg total) by mouth every 4 (four) hours. (Patient not taking: Reported on 10/27/2017) 90 tablet 0  . psyllium (METAMUCIL) 58.6 % packet Take 1 packet by mouth daily.    Marland Kitchen VITAMIN A PO Take 1 capsule by mouth daily. 2400 mcg     Current Facility-Administered Medications  Medication Dose Route Frequency Provider Last Rate Last Dose  . 0.9 %  sodium chloride infusion  500 mL Intravenous Once Nandigam, Venia Minks, MD        REVIEW OF SYSTEMS:  REVIEW OF SYSTEMS: A 10+ POINT REVIEW OF SYSTEMS WAS OBTAINED including neurology, dermatology, psychiatry, cardiac, respiratory, lymph, extremities, GI, GU, musculoskeletal, constitutional, reproductive, HEENT. All pertinent positives are noted in the HPI. All others are negative.   PHYSICAL EXAM: Vitals with BMI 03/11/2018  Height _0   Weight 148 lbs 3 oz  BMI 69.62  Systolic 952  Diastolic 88  Pulse 841  Respirations 18    Lungs are clear to auscultation  bilaterally. Heart has regular rate and rhythm. No palpable cervical, supraclavicular, or axillary adenopathy. Abdomen soft, non-tender, normal bowel sounds.  Breast: Left breast with no palpable mass, nipple discharge, or bleeding. Right breast with significant bruising in medial aspect, no dominant mass, nipple discharge or bleeding.    KPS = 100   100 - Normal; no complaints; no evidence of disease. 90   - Able to carry on normal activity; minor signs or symptoms of disease. 80   - Normal activity with effort; some signs or symptoms of disease. 18   - Cares for self; unable to carry on normal activity or to do active work. 60   - Requires occasional assistance, but is able to care for most of his personal needs. 50   - Requires considerable assistance and frequent medical care. 96   - Disabled; requires special care and assistance. 58   - Severely disabled; hospital admission is indicated although death not imminent. 28   - Very sick; hospital admission necessary; active supportive treatment necessary. 10   - Moribund; fatal processes progressing  rapidly. 0     - Dead  Karnofsky DA, Abelmann Briggs, Craver LS and Burchenal [facility] [phone]) The use of the nitrogen mustards in the palliative treatment of carcinoma: with particular reference to bronchogenic carcinoma Cancer 1 634-56  LABORATORY DATA:  Lab Results  Component Value Date   WBC 5.5 03/11/2018   HGB 13.4 03/11/2018   HCT 38.7 03/11/2018   MCV 92.6 03/11/2018   PLT 245 03/11/2018   Lab Results  Component Value Date   NA 144 03/11/2018   K 3.9 03/11/2018   CL 104 03/11/2018   CO2 28 03/11/2018   Lab Results  Component Value Date   ALT 12 03/11/2018   AST 17 03/11/2018   ALKPHOS 84 03/11/2018   BILITOT 0.2 (L) 03/11/2018    RADIOGRAPHY: No results found.    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   Patient will be a good candidate for breast  conservation with radiotherapy to the right breast. Prior to her surgery patient will proceed with MRI to further evaluate lesion size as it relates to her breast size.   Today, I talked to the patient and family about the findings and work-up thus far.  We discussed the natural history of noninvasive breast cancer and general treatment, highlighting the role of radiotherapy in the management.  We discussed the available radiation techniques, and focused on the details of logistics and delivery.  We reviewed the anticipated acute and late sequelae associated with radiation in this setting.  The patient was encouraged to ask questions that I answered to the best of my ability.    PLAN:   1. MRI 2. Right lumpectomy 3. Adjuvant radiation 4. Tamoxifen (fibromyalgia)    ------------------------------------------------  Blair Promise, PhD, MD   This document serves as a record of services personally performed by Gery Pray, MD. It was created on his behalf by Mary-Margaret Loma Messing, a trained medical scribe. The creation of this record is based on the scribe's personal observations and the provider's statements to them. This document has been checked and approved by the attending provider.

## 2018-03-11 NOTE — Assessment & Plan Note (Signed)
03/02/2018: Screening detected right breast architectural distortion with calcifications, 3.6 cm at 1 o'clock position 4 cm from nipple, axilla negative, biopsy revealed high-grade DCIS ER 90%, PR 90% with atypical lobular hyperplasia, Tis NX stage 0  Pathology review: I discussed with the patient the difference between DCIS and invasive breast cancer. It is considered a precancerous lesion. DCIS is classified as a 0. It is generally detected through mammograms as calcifications. We discussed the significance of grades and its impact on prognosis. We also discussed the importance of ER and PR receptors and their implications to adjuvant treatment options. Prognosis of DCIS dependence on grade, comedo necrosis. It is anticipated that if not treated, 20-30% of DCIS can develop into invasive breast cancer.  Recommendation: MRI of the breast 1. Breast conserving surgery 2. Followed by adjuvant radiation therapy 3. Followed by antiestrogen therapy with tamoxifen 5 years  Tamoxifen counseling: We discussed the risks and benefits of tamoxifen. These include but not limited to insomnia, hot flashes, mood changes, vaginal dryness, and weight gain. Although rare, serious side effects including endometrial cancer, risk of blood clots were also discussed. We strongly believe that the benefits far outweigh the risks. Patient understands these risks and consented to starting treatment. Planned treatment duration is 5 years.  Return to clinic after surgery to discuss the final pathology report and come up with an adjuvant treatment plan.

## 2018-03-11 NOTE — Progress Notes (Signed)
Clinical Social Work [place] Psychosocial Distress Screening [place]  Patient completed distress screening protocol and scored a 6 on the Psychosocial Distress Thermometer which indicates moderate distress. Clinical Social Worker met with patient and patients husband in [facility] to assess for distress and other psychosocial needs. Patient stated she was feeling overwhelmed but felt "better" after meeting with the treatment team and getting more information on her treatment plan. CSW and patient discussed common feeling and emotions when being diagnosed with cancer, and the importance of support during treatment. CSW informed patient of the support team and support services at [facility]. CSW provided contact information and encouraged patient to call with any questions or concerns.  ONCBCN DISTRESS SCREENING 03/11/2018  Screening Type Initial Screening  Distress experienced in past week (1-10) 6  Emotional problem type Nervousness/Anxiety  Information Concerns Type Lack of info about diagnosis;Lack of info about treatment;Lack of info about complementary therapy choices;Lack of info about maintaining fitness  Physical Problem type Sleep/insomnia  Physician notified of physical symptoms Yes     Johnnye Lana, MSW, LCSW, OSW-C Clinical Social Worker [place] [phone]

## 2018-03-11 NOTE — Telephone Encounter (Signed)
No 10/16 los orders.  °

## 2018-03-11 NOTE — Progress Notes (Signed)
[place] NOTE  Patient Care Team: Thressa Sheller, MD as PCP - General (Internal Medicine) Excell Seltzer, MD as Consulting Physician (General Surgery) Nicholas Lose, MD as Consulting Physician (Hematology and Oncology) Gery Pray, MD as Consulting Physician (Radiation Oncology)  CHIEF COMPLAINTS/PURPOSE OF CONSULTATION:  Newly diagnosed right breast DCIS  HISTORY OF PRESENTING ILLNESS:  Jodi Gutierrez 67 y.o. female is here because of recent diagnosis of right breast DCIS.  Patient had a screening mammogram that detected right breast architectural distortion with calcifications measuring 3.6 cm at 1 o'clock position 4 cm from the nipple.  Axilla was negative.  Biopsy of this revealed high-grade DCIS that was ER PR positive.  She was presented this morning to the multidisciplinary tumor board and she is here today accompanied by her family to discuss her treatment plan.  I reviewed her records extensively and collaborated the history with the patient.  MEDICAL HISTORY:  Past Medical History:  Diagnosis Date  . Anemia    hx of as child and early adult   . Arthritis   . Claustrophobia   . Fibromyalgia   . H/O fibromyalgia     SURGICAL HISTORY: Past Surgical History:  Procedure Laterality Date  . BUNIONECTOMY     bil  . COLONOSCOPY    . FINGER SURGERY     right ring finger /1994  . KNEE ARTHROSCOPY Right   . KNEE SURGERY     left / arthroscopic  . TOTAL KNEE ARTHROPLASTY Right 02/15/2014   Procedure: RIGHT TOTAL KNEE ARTHROPLASTY;  Surgeon: Mauri Pole, MD;  Location: [facility];  Service: Orthopedics;  Laterality: Right;  . TOTAL KNEE ARTHROPLASTY Left 03/25/2016   Procedure: LEFT TOTAL KNEE ARTHROPLASTY;  Surgeon: Paralee Cancel, MD;  Location: [facility];  Service: Orthopedics;  Laterality: Left;  Adductor Block    SOCIAL HISTORY: Social History   Socioeconomic History  . Marital status: Married    Spouse name: Not on file  . Number of  children: Not on file  . Years of education: Not on file  . Highest education level: Not on file  Occupational History  . Not on file  Social Needs  . Financial resource strain: Not on file  . Food insecurity:    Worry: Not on file    Inability: Not on file  . Transportation needs:    Medical: Not on file    Non-medical: Not on file  Tobacco Use  . Smoking status: Never Smoker  . Smokeless tobacco: Never Used  Substance and Sexual Activity  . Alcohol use: Yes    Alcohol/week: 1.0 standard drinks    Types: 1 Glasses of wine per week    Comment: 5 drinks per week   . Drug use: No  . Sexual activity: Not on file  Lifestyle  . Physical activity:    Days per week: Not on file    Minutes per session: Not on file  . Stress: Not on file  Relationships  . Social connections:    Talks on phone: Not on file    Gets together: Not on file    Attends religious service: Not on file    Active member of club or organization: Not on file    Attends meetings of clubs or organizations: Not on file    Relationship status: Not on file  . Intimate partner violence:    Fear of current or ex partner: Not on file    Emotionally abused: Not on file  Physically abused: Not on file    Forced sexual activity: Not on file  Other Topics Concern  . Not on file  Social History Narrative  . Not on file    FAMILY HISTORY: Family History  Problem Relation Age of Onset  . Colon polyps Brother   . Colon cancer Sister   . Esophageal cancer Neg Hx   . Rectal cancer Neg Hx   . Stomach cancer Neg Hx     ALLERGIES:  is allergic to silver; tape; latex; nickel; and penicillins.  MEDICATIONS:  Current Outpatient Medications  Medication Sig Dispense Refill  . acetaminophen (TYLENOL) 500 MG tablet Take 2 tablets (1,000 mg total) by mouth every 8 (eight) hours. 30 tablet 0  . amitriptyline (ELAVIL) 25 MG tablet Take 25 mg by mouth at bedtime.    . calcium carbonate (OSCAL) 1500 (600 Ca) MG TABS  tablet Take 600 mg by mouth 2 (two) times daily with a meal.    . Cholecalciferol (VITAMIN D3) 5000 units TBDP Take 10,000 Units by mouth daily.     . diazepam (VALIUM) 2 MG tablet Take 2 mg by mouth at bedtime as needed for sedation.     Marland Kitchen estradiol (ESTRACE) 1 MG tablet Take 5 mg by mouth daily.    Marland Kitchen ibuprofen (ADVIL,MOTRIN) 200 MG tablet Take 200 mg by mouth every 6 (six) hours as needed.    . Multiple Vitamin (MULTIVITAMIN) tablet Take 1 tablet by mouth every morning.     . psyllium (METAMUCIL) 58.6 % packet Take 1 packet by mouth daily.    Marland Kitchen VITAMIN A PO Take 1 capsule by mouth daily. 2400 mcg    . ferrous sulfate 325 (65 FE) MG tablet Take 1 tablet (325 mg total) by mouth 3 (three) times daily after meals. (Patient not taking: Reported on 10/27/2017)  3  . methocarbamol (ROBAXIN) 500 MG tablet Take 1 tablet (500 mg total) by mouth every 6 (six) hours as needed for muscle spasms. (Patient not taking: Reported on 03/11/2018) 20 tablet 0   Current Facility-Administered Medications  Medication Dose Route Frequency Provider Last Rate Last Dose  . 0.9 %  sodium chloride infusion  500 mL Intravenous Once Nandigam, Venia Minks, MD        REVIEW OF SYSTEMS:   Constitutional: Denies fevers, chills or abnormal night sweats Eyes: Denies blurriness of vision, double vision or watery eyes Ears, nose, mouth, throat, and face: Denies mucositis or sore throat Respiratory: Denies cough, dyspnea or wheezes Cardiovascular: Denies palpitation, chest discomfort or lower extremity swelling Gastrointestinal:  Denies nausea, heartburn or change in bowel habits Skin: Denies abnormal skin rashes Lymphatics: Denies new lymphadenopathy or easy bruising Neurological:Denies numbness, tingling or new weaknesses Behavioral/Psych: Mood is stable, no new changes  Breast:  Denies any palpable lumps or discharge All other systems were reviewed with the patient and are negative.  PHYSICAL EXAMINATION: ECOG PERFORMANCE  STATUS: 0 - Asymptomatic  Vitals:   03/11/18 0846  BP: (!) 166/88  Pulse: (!) 103  Resp: 18  Temp: 98 F (36.7 C)  SpO2: 99%   Filed Weights   03/11/18 0846  Weight: 148 lb 3.2 oz (67.2 kg)    GENERAL:alert, no distress and comfortable SKIN: skin color, texture, turgor are normal, no rashes or significant lesions EYES: normal, conjunctiva are pink and non-injected, sclera clear OROPHARYNX:no exudate, no erythema and lips, buccal mucosa, and tongue normal  NECK: supple, thyroid normal size, non-tender, without nodularity LYMPH:  no palpable lymphadenopathy in  the cervical, axillary or inguinal LUNGS: clear to auscultation and percussion with normal breathing effort HEART: regular rate & rhythm and no murmurs and no lower extremity edema ABDOMEN:abdomen soft, non-tender and normal bowel sounds Musculoskeletal:no cyanosis of digits and no clubbing  PSYCH: alert & oriented x 3 with fluent speech NEURO: no focal motor/sensory deficits BREAST: No palpable nodules in breast. No palpable axillary or supraclavicular lymphadenopathy (exam performed in the presence of a chaperone)   LABORATORY DATA:  I have reviewed the data as listed Lab Results  Component Value Date   WBC 5.5 03/11/2018   HGB 13.4 03/11/2018   HCT 38.7 03/11/2018   MCV 92.6 03/11/2018   PLT 245 03/11/2018   Lab Results  Component Value Date   NA 144 03/11/2018   K 3.9 03/11/2018   CL 104 03/11/2018   CO2 28 03/11/2018    RADIOGRAPHIC STUDIES: I have personally reviewed the radiological reports and agreed with the findings in the report.  ASSESSMENT AND PLAN:  Ductal carcinoma in situ (DCIS) of right breast 03/02/2018: Screening detected right breast architectural distortion with calcifications, 3.6 cm at 1 o'clock position 4 cm from nipple, axilla negative, biopsy revealed high-grade DCIS ER 90%, PR 90% with atypical lobular hyperplasia, Tis NX stage 0  Pathology review: I discussed with the patient the  difference between DCIS and invasive breast cancer. It is considered a precancerous lesion. DCIS is classified as a 0. It is generally detected through mammograms as calcifications. We discussed the significance of grades and its impact on prognosis. We also discussed the importance of ER and PR receptors and their implications to adjuvant treatment options. Prognosis of DCIS dependence on grade, comedo necrosis. It is anticipated that if not treated, 20-30% of DCIS can develop into invasive breast cancer.  Recommendation: MRI of the breast 1. Breast conserving surgery 2. Followed by adjuvant radiation therapy 3. Followed by antiestrogen therapy with tamoxifen 5 years  Tamoxifen counseling: We discussed the risks and benefits of tamoxifen. These include but not limited to insomnia, hot flashes, mood changes, vaginal dryness, and weight gain. Although rare, serious side effects including endometrial cancer, risk of blood clots were also discussed. We strongly believe that the benefits far outweigh the risks. Patient understands these risks and consented to starting treatment. Planned treatment duration is 5 years.  Return to clinic after surgery to discuss the final pathology report and come up with an adjuvant treatment plan.     All questions were answered. The patient knows to call the clinic with any problems, questions or concerns.    Harriette Ohara, MD 03/11/18

## 2018-03-11 NOTE — Progress Notes (Signed)
Nutrition Assessment  Reason for Assessment:  Pt seen in Breast Clinic  ASSESSMENT:   67 year old female with new diagnosis of breast cancer. Past medical history reviewed.    Patient reports good appetite.   Medications:  reviewed  Labs: reviewed  Anthropometrics:   Height: 71 inches Weight: 148 lb BMI: 20   NUTRITION DIAGNOSIS: Food and nutrition related knowledge deficit related to new diagnosis of breast cancer as evidenced by no prior need for nutrition related information.  INTERVENTION:   Discussed and provided packet of information regarding nutritional tips for breast cancer patients.  Questions answered.  Teachback method used.  Contact information provided and patient knows to contact me with questions/concerns.    MONITORING, EVALUATION, and GOAL: Pt will consume a healthy plant based diet to maintain lean body mass throughout treatment.   Jodi Gutierrez, [place], [place] Registered Dietitian [phone] (pager)

## 2018-03-13 ENCOUNTER — Ambulatory Visit (HOSPITAL_COMMUNITY)
Admission: RE | Admit: 2018-03-13 | Discharge: 2018-03-13 | Disposition: A | Discharge status: Home / Self Care | Payer: 59 | Source: Ambulatory Visit | Attending: General Surgery | Admitting: General Surgery

## 2018-03-13 DIAGNOSIS — D0511 Intraductal carcinoma in situ of right breast: Secondary | ICD-10-CM | POA: Diagnosis present

## 2018-03-13 DIAGNOSIS — C50211 Malignant neoplasm of upper-inner quadrant of right female breast: Secondary | ICD-10-CM | POA: Diagnosis not present

## 2018-03-13 MED ORDER — GADOBUTROL 1 MMOL/ML IV SOLN
7.5000 mL | Freq: Once | INTRAVENOUS | Status: AC | PRN
  Administered 2018-03-13: 6 mL via INTRAVENOUS

## 2018-03-23 ENCOUNTER — Other Ambulatory Visit: Payer: Self-pay | Admitting: Radiology

## 2018-04-06 ENCOUNTER — Ambulatory Visit: Payer: Self-pay | Admitting: General Surgery

## 2018-04-06 DIAGNOSIS — D0511 Intraductal carcinoma in situ of right breast: Secondary | ICD-10-CM

## 2018-04-15 NOTE — Pre-Procedure Instructions (Signed)
Jodi Gutierrez  04/15/2018      [id number] [address], [place] [address] Phone: [phone] Fax: [phone]  [facility] [place], [place] [place] [address] [address] Phone: [phone] Fax: [phone]    Your procedure is scheduled on Monday November 25th.  Report to [facility] Admitting at 1:00 P.M.  Call this number if you have problems the morning of surgery:  [phone]   Remember:  Do not eat after midnight.  You may drink clear liquids until 1200pm .  Clear liquids allowed are:                    Water, Juice (non-citric and without pulp), Carbonated beverages, Clear Tea, Black Coffee only and Gatorade    Take these medicines the morning of surgery with A SIP OF WATER   acetaminophen (TYLENOL) if needed   7 days prior to surgery STOP taking any Aspirin(unless otherwise instructed by your surgeon), Aleve, Naproxen, Ibuprofen, Motrin, Advil, Goody's, BC's, all herbal medications, fish oil, and all vitamins   Do not wear jewelry, make-up or nail polish.  Do not wear lotions, powders, or perfumes, or deodorant.  Do not shave 48 hours prior to surgery.  Men may shave face and neck.  Do not bring valuables to the hospital.  [facility] is not responsible for any belongings or valuables.  Contacts, dentures or bridgework may not be worn into surgery.  Leave your suitcase in the car.  After surgery it may be brought to your room.  For patients admitted to the hospital, discharge time will be determined by your treatment team.  Patients discharged the day of surgery will not be allowed to drive home.    - Preparing For Surgery  Before surgery, you can play an important role. Because skin is not sterile, your skin needs to be as free of germs as possible. You can reduce the number of germs on your  skin by washing with CHG (chlorahexidine gluconate) Soap before surgery.  CHG is an antiseptic cleaner which kills germs and bonds with the skin to continue killing germs even after washing.    Oral Hygiene is also important to reduce your risk of infection.  Remember - BRUSH YOUR TEETH THE MORNING OF SURGERY WITH YOUR REGULAR TOOTHPASTE  Please do not use if you have an allergy to CHG or antibacterial soaps. If your skin becomes reddened/irritated stop using the CHG.  Do not shave (including legs and underarms) for at least 48 hours prior to first CHG shower. It is OK to shave your face.  Please follow these instructions carefully.   1. Shower the NIGHT BEFORE SURGERY and the MORNING OF SURGERY with CHG.   2. If you chose to wash your hair, wash your hair first as usual with your normal shampoo.  3. After you shampoo, rinse your hair and body thoroughly to remove the shampoo.  4. Use CHG as you would any other liquid soap. You can apply CHG directly to the skin and wash gently with a scrungie or a clean washcloth.   5. Apply the CHG Soap to your body ONLY FROM THE NECK DOWN.  Do not use on open wounds or open sores. Avoid contact with your eyes, ears, mouth and genitals (private parts). Wash Face and genitals (private parts)  with your normal soap.  6. Wash thoroughly, paying special attention to the area where your surgery will be performed.  7. Thoroughly rinse your body with warm water from the neck down.  8. DO NOT shower/wash with your normal soap after using and rinsing off the CHG Soap.  9. Pat yourself dry with a CLEAN TOWEL.  10. Wear CLEAN PAJAMAS to bed the night before surgery, wear comfortable clothes the morning of surgery  11. Place CLEAN SHEETS on your bed the night of your first shower and DO NOT SLEEP WITH PETS.    Day of Surgery:  Do not apply any deodorants/lotions.  Please wear clean clothes to the hospital/surgery center.   Remember to brush your teeth WITH  YOUR REGULAR TOOTHPASTE.    Please read over the following fact sheets that you were given.

## 2018-04-16 ENCOUNTER — Encounter (HOSPITAL_COMMUNITY)
Admission: RE | Admit: 2018-04-16 | Discharge: 2018-04-16 | Disposition: A | Discharge status: Home / Self Care | Payer: 59 | Source: Ambulatory Visit | Attending: General Surgery | Admitting: General Surgery

## 2018-04-16 ENCOUNTER — Encounter (HOSPITAL_COMMUNITY): Payer: Self-pay

## 2018-04-16 ENCOUNTER — Telehealth: Payer: Self-pay | Admitting: Hematology and Oncology

## 2018-04-16 ENCOUNTER — Other Ambulatory Visit: Payer: Self-pay

## 2018-04-16 DIAGNOSIS — Z01812 Encounter for preprocedural laboratory examination: Secondary | ICD-10-CM | POA: Diagnosis not present

## 2018-04-16 HISTORY — DX: Malignant neoplasm of unspecified site of unspecified female breast: C50.919

## 2018-04-16 LAB — COMPREHENSIVE METABOLIC PANEL
ALT: 17 U/L (ref 0–44)
AST: 22 U/L (ref 15–41)
Albumin: 3.6 g/dL (ref 3.5–5.0)
Alkaline Phosphatase: 72 U/L (ref 38–126)
Anion gap: 7 (ref 5–15)
BUN: 11 mg/dL (ref 8–23)
CHLORIDE: 104 mmol/L (ref 98–111)
CO2: 28 mmol/L (ref 22–32)
CREATININE: 0.95 mg/dL (ref 0.44–1.00)
Calcium: 9.2 mg/dL (ref 8.9–10.3)
GFR calc non Af Amer: 60 mL/min — ABNORMAL LOW (ref >60)
Glucose, Bld: 110 mg/dL — ABNORMAL HIGH (ref 70–99)
POTASSIUM: 3.7 mmol/L (ref 3.5–5.1)
SODIUM: 139 mmol/L (ref 135–145)
TOTAL PROTEIN: 6.8 g/dL (ref 6.5–8.1)
Total Bilirubin: 0.5 mg/dL (ref 0.3–1.2)

## 2018-04-16 LAB — CBC
HCT: 41.8 % (ref 36.0–46.0)
Hemoglobin: 13.5 g/dL (ref 12.0–15.0)
MCH: 30.7 pg (ref 26.0–34.0)
MCHC: 32.3 g/dL (ref 30.0–36.0)
MCV: 95 fL (ref 80.0–100.0)
Platelets: 264 10*3/uL (ref 150–400)
RBC: 4.4 MIL/uL (ref 3.87–5.11)
RDW: 12 % (ref 11.5–15.5)
WBC: 4.2 10*3/uL (ref 4.0–10.5)
nRBC: 0 % (ref 0.0–0.2)

## 2018-04-16 NOTE — Telephone Encounter (Signed)
Scheduled appt per 11/20 sch message - patient is aware of appt date and time.

## 2018-04-16 NOTE — Progress Notes (Signed)
PCP - Dr. Audie Pinto  Cardiologist - Denies  Chest x-ray - Denies  EKG - Denies  Stress Test - Denies  ECHO - Denies  Cardiac Cath - Denies  AICD- na  PM- na LOOP- na  Sleep Study - Denies CPAP - None  LABS- 04/16/18: CBC, CMP  ASA- Denies   Anesthesia- No  Pt denies having chest pain, sob, or fever at this time. All instructions explained to the pt, with a verbal understanding of the material. Pt agrees to go over the instructions while at home for a better understanding. The opportunity to ask questions was provided.

## 2018-04-17 NOTE — H&P (Signed)
History of Present Illness Marland Kitchen T. Casson Catena MD; 03/26/2018 9:54 AM) The patient is a 67 year old female who presents with breast cancer. She returns for further treatment planning for new diagnosis of DCIS of the right breast. Her original presentation was as follows: She is a postmenopausal female referred by Dr. Emmit Pomfret for evaluation of recently diagnosed carcinoma of the right breast. She recently presented for a screening mamogram revealing a new area of distortion and calcifications in the upper inner right breast. Subsequent imaging included diagnostic mamogram showing a 3-4 cm area of persistent distortion and pleomorphic calcifications and ultrasound showing A3.6 centimeters solid mass containing calcifications at the 2:00 position anterior depth 4 cm from the nipple. ultrasound of the axilla was negative. 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 She is seen now in breast multidisciplinary clinic for initial treatment planning. She has experienced no breast symptoms, specifically lump or pain, skin changes or nipple discharge. She does not have a personal history of any previous breast problems. she currently is on estrogen replacement which I instructed her to stop.  Findings at that time were the following: Tumor size: 3.6 cm Tumor grade: High Estrogen Receptor: 90% positive Progesteron 90% positive Lymph node status: negative  Subsequently we have obtained a preoperative MRI. This showed been known proven malignancy in the upper inner quadrant of the right breast but 2 nodular areas of enhancement in the retroareolar right breast that were suspicious and one of these containing calcifications. Subsequent stereotactic biopsy was performed of the one area containing calcifications which has also shown ductal carcinoma in situ low-grade. Prognostic panel pending.  There is one additional area not biopsied. I reviewed this with the patient and her husband. We have at least to widely separate areas and probably 3 areas in the breast.   Problem List/Past Medical Marland Kitchen T. Rindy Kollman, MD; 03/26/2018 9:54 AM) FIBROMYALGIA (M79.7)  DUCTAL CARCINOMA IN SITU (DCIS) OF RIGHT BREAST (D05.11)   Past Surgical History Marland Kitchen T. Jireh Elmore, MD; 03/26/2018 9:54 AM) Foot Surgery  Bilateral. Knee Surgery  Bilateral.  Diagnostic Studies History Marland Kitchen T. Jaiah Weigel, MD; 03/26/2018 9:54 AM) Colonoscopy  within last year Mammogram  within last year Pap Smear  1-5 years ago  Allergies Malachi Bonds, CMA; 03/26/2018 9:23 AM) Silver  Swelling. Blisters, hypersensitivity Adhesive Tape  Rash. Redness Nickel  Blisters Penicillins  Knots under arm per Epic Latex  Patient is not sure if she is allergic or what reaction is Allergies Reconciled   Medication History (Chemira Jones, CMA; 03/26/2018 9:23 AM) Tylenol (500MG  Capsule, Oral) Active. Elavil (25MG  Tablet, Oral) Active. Oscal 500/200 D-3 (500-200MG -UNIT Tablet, Oral) Active. Vitamin D-3 (5000UNIT Tablet, Oral) Active. Valium (2MG  Tablet, Oral) Active. Colace (100MG  Capsule, Oral) Active. Estrace (1MG  Tablet, Oral) Active. Ferrous Sulfate (325 (65 Fe)MG Tablet, Oral) Active. Advil (200MG  Tablet, Oral) Active. Robaxin (500MG  Tablet, Oral) Active. Multiple Vitamin (1 (one) Oral) Active. Naproxen (375MG  Tablet, Oral) Active. Metamucil (58.6% Packet, Oral) Active. Vitamin A (8000UNIT Capsule, Oral) Active. Medications Reconciled  Social History Marland Kitchen T. Kyomi Hector, MD; 03/26/2018 9:54 AM) Alcohol use  Occasional alcohol use. Caffeine use  Carbonated beverages, Coffee, Tea. No drug use  Tobacco use  Former smoker.  Family History Marland Kitchen T. Yifan Auker, MD; 03/26/2018 9:54 AM) Prostate Cancer  Father. Thyroid problems  Sister.  Pregnancy / Birth History Marland Kitchen  T. Erika Slaby, MD; 03/26/2018 9:54 AM) Age at menarche  31 years. Age of  menopause  51-53 Contraceptive History  Contraceptive implant, Depo-provera, Intrauterine device, Oral contraceptives. Gravida  0 Para  0 Regular periods   Other Problems Marland Kitchen T. Angellica Maddison, MD; 03/26/2018 9:54 AM) Arthritis  Back Pain  Other disease, cancer, significant illness   Vitals (Chemira Jones CMA; 03/26/2018 9:23 AM) 03/26/2018 9:22 AM Weight: 143.6 lb Height: 70.5in Body Surface Area: 1.82 m Body Mass Index: 20.31 kg/m  Temp.: 97.37F  Pulse: 104 (Regular)  BP: 122/80 (Sitting, Left Arm, Standard)       Physical Exam Marland Kitchen T. Taheera Thomann MD; 03/26/2018 9:56 AM) The physical exam findings are as follows: Note:General: Alert, thin Caucasian female, in no distress Skin: Warm and dry without rash or infection. HEENT: No palpable masses or thyromegaly. Sclera nonicteric. Pupils equal round and reactive. Lymph nodes: No cervical, supraclavicular, or inguinal nodes palpable. Breasts: Bruising and thickening upper inner right breast post biopsy. No other palpable abnormalities in either breast. No axillary adenopathy. Lungs: Breath sounds clear and equal. No wheezing or increased work of breathing. Cardiovascular: Regular rate and rhythm without murmer. No JVD or edema. Peripheral pulses intact. Abdomen: Nondistended. Soft and nontender. No masses palpable. No organomegaly. Neurologic: Alert and fully oriented. Gait normal. No focal weakness. Psychiatric: Normal mood and affect. Thought content appropriate with normal judgement and insight    Assessment & Plan Marland Kitchen T. 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 Clinical stage 0, high-grade, ER/PR positive. Now a second area of biopsy-proven low-grade DCIS retroareolar and  a third area of abnormality on MRI not biopsied. I do not think breast conservation is feasible. I recommended total mastectomy with axillary sentinel lymph node biopsy on the chance we find invasive disease.   We subsequently obtained plastic surgery evaluation and the patient has decided against reconstruction at this time and desires to proceed as planned with right total mastectomy and axillary sentinel lymph node biopsy.

## 2018-04-20 ENCOUNTER — Encounter (HOSPITAL_COMMUNITY)
Admission: RE | Disposition: A | Discharge status: Home / Self Care | Payer: Self-pay | Source: Ambulatory Visit | Attending: General Surgery

## 2018-04-20 ENCOUNTER — Observation Stay (HOSPITAL_COMMUNITY)
Admission: RE | Admit: 2018-04-20 | Discharge: 2018-04-21 | Disposition: A | Discharge status: Home / Self Care | Payer: 59 | Source: Ambulatory Visit | Attending: General Surgery | Admitting: General Surgery

## 2018-04-20 ENCOUNTER — Ambulatory Visit (HOSPITAL_COMMUNITY): Payer: 59 | Admitting: Certified Registered"

## 2018-04-20 ENCOUNTER — Ambulatory Visit (HOSPITAL_COMMUNITY)
Admission: RE | Admit: 2018-04-20 | Discharge: 2018-04-20 | Disposition: A | Discharge status: Home / Self Care | Payer: 59 | Source: Ambulatory Visit | Attending: General Surgery | Admitting: General Surgery

## 2018-04-20 ENCOUNTER — Encounter (HOSPITAL_COMMUNITY): Payer: Self-pay | Admitting: Anesthesiology

## 2018-04-20 DIAGNOSIS — C50411 Malignant neoplasm of upper-outer quadrant of right female breast: Secondary | ICD-10-CM | POA: Diagnosis present

## 2018-04-20 DIAGNOSIS — Z170 Estrogen receptor positive status [ER+]: Secondary | ICD-10-CM | POA: Diagnosis not present

## 2018-04-20 DIAGNOSIS — D0511 Intraductal carcinoma in situ of right breast: Secondary | ICD-10-CM

## 2018-04-20 HISTORY — PX: MASTECTOMY W/ SENTINEL NODE BIOPSY: SHX2001

## 2018-04-20 SURGERY — MASTECTOMY WITH SENTINEL LYMPH NODE BIOPSY
Anesthesia: General | Site: Breast | Laterality: Right

## 2018-04-20 MED ORDER — HYDROMORPHONE HCL 1 MG/ML IJ SOLN
0.2500 mg | INTRAMUSCULAR | Status: DC | PRN
  Administered 2018-04-20 (×4): 0.5 mg via INTRAVENOUS

## 2018-04-20 MED ORDER — SODIUM CHLORIDE (PF) 0.9 % IJ SOLN
INTRAMUSCULAR | Status: AC
  Filled 2018-04-20: qty 10

## 2018-04-20 MED ORDER — FENTANYL CITRATE (PF) 250 MCG/5ML IJ SOLN
INTRAMUSCULAR | Status: AC
  Filled 2018-04-20: qty 5

## 2018-04-20 MED ORDER — ROCURONIUM BROMIDE 50 MG/5ML IV SOSY
PREFILLED_SYRINGE | INTRAVENOUS | Status: AC
  Filled 2018-04-20: qty 5

## 2018-04-20 MED ORDER — ENOXAPARIN SODIUM 40 MG/0.4ML ~~LOC~~ SOLN
40.0000 mg | SUBCUTANEOUS | Status: DC
  Administered 2018-04-21: 40 mg via SUBCUTANEOUS
  Filled 2018-04-20: qty 0.4

## 2018-04-20 MED ORDER — MORPHINE SULFATE (PF) 2 MG/ML IV SOLN: 2.0000 mg | INTRAVENOUS | Status: DC | PRN

## 2018-04-20 MED ORDER — MIDAZOLAM HCL 2 MG/2ML IJ SOLN
INTRAMUSCULAR | Status: AC
  Administered 2018-04-20: 2 mg via INTRAVENOUS
  Filled 2018-04-20: qty 2

## 2018-04-20 MED ORDER — ACETAMINOPHEN 500 MG PO TABS
1000.0000 mg | ORAL_TABLET | Freq: Three times a day (TID) | ORAL | Status: DC
  Administered 2018-04-20 – 2018-04-21 (×2): 1000 mg via ORAL
  Filled 2018-04-20 (×2): qty 2

## 2018-04-20 MED ORDER — HYDROMORPHONE HCL 1 MG/ML IJ SOLN
INTRAMUSCULAR | Status: AC
  Administered 2018-04-20: 0.5 mg via INTRAVENOUS
  Filled 2018-04-20: qty 1

## 2018-04-20 MED ORDER — CELECOXIB 200 MG PO CAPS
200.0000 mg | ORAL_CAPSULE | ORAL | Status: DC
  Filled 2018-04-20: qty 1

## 2018-04-20 MED ORDER — SODIUM CHLORIDE 0.9 % IV SOLN
INTRAVENOUS | Status: DC | PRN
  Administered 2018-04-20: 20 ug/min via INTRAVENOUS

## 2018-04-20 MED ORDER — ONDANSETRON HCL 4 MG/2ML IJ SOLN
INTRAMUSCULAR | Status: DC | PRN
  Administered 2018-04-20: 4 mg via INTRAVENOUS

## 2018-04-20 MED ORDER — BUPIVACAINE HCL (PF) 0.25 % IJ SOLN
INTRAMUSCULAR | Status: DC | PRN
  Administered 2018-04-20: 5 mL
  Administered 2018-04-20: 5 mL via EPIDURAL
  Administered 2018-04-20 (×10): 5 mL

## 2018-04-20 MED ORDER — PROPOFOL 10 MG/ML IV BOLUS
INTRAVENOUS | Status: AC
  Filled 2018-04-20: qty 20

## 2018-04-20 MED ORDER — CIPROFLOXACIN IN D5W 400 MG/200ML IV SOLN
400.0000 mg | INTRAVENOUS | Status: AC
  Administered 2018-04-20: 400 mg via INTRAVENOUS
  Filled 2018-04-20: qty 200

## 2018-04-20 MED ORDER — DEXAMETHASONE SODIUM PHOSPHATE 10 MG/ML IJ SOLN
INTRAMUSCULAR | Status: AC
  Filled 2018-04-20: qty 2

## 2018-04-20 MED ORDER — TECHNETIUM TC 99M SULFUR COLLOID FILTERED: 1.0000 | Freq: Once | INTRAVENOUS | Status: DC | PRN

## 2018-04-20 MED ORDER — BUPIVACAINE LIPOSOME 1.3 % IJ SUSP
INTRAMUSCULAR | Status: DC | PRN
  Administered 2018-04-20 (×10): 1 mL via PERINEURAL

## 2018-04-20 MED ORDER — FENTANYL CITRATE (PF) 100 MCG/2ML IJ SOLN
100.0000 ug | Freq: Once | INTRAMUSCULAR | Status: AC
  Administered 2018-04-20: 100 ug via INTRAVENOUS

## 2018-04-20 MED ORDER — MIDAZOLAM HCL 2 MG/2ML IJ SOLN
2.0000 mg | Freq: Once | INTRAMUSCULAR | Status: AC
  Administered 2018-04-20: 2 mg via INTRAVENOUS

## 2018-04-20 MED ORDER — PROPOFOL 10 MG/ML IV BOLUS
INTRAVENOUS | Status: DC | PRN
  Administered 2018-04-20: 150 mg via INTRAVENOUS

## 2018-04-20 MED ORDER — CHLORHEXIDINE GLUCONATE CLOTH 2 % EX PADS: 6.0000 | MEDICATED_PAD | Freq: Once | CUTANEOUS | Status: DC

## 2018-04-20 MED ORDER — DIAZEPAM 5 MG PO TABS: 5.0000 mg | ORAL_TABLET | Freq: Every evening | ORAL | Status: DC | PRN

## 2018-04-20 MED ORDER — LIDOCAINE HCL (CARDIAC) PF 100 MG/5ML IV SOSY
PREFILLED_SYRINGE | INTRAVENOUS | Status: DC | PRN
  Administered 2018-04-20: 100 mg via INTRAVENOUS

## 2018-04-20 MED ORDER — CELECOXIB 200 MG PO CAPS
200.0000 mg | ORAL_CAPSULE | Freq: Two times a day (BID) | ORAL | Status: DC
  Administered 2018-04-20 – 2018-04-21 (×2): 200 mg via ORAL
  Filled 2018-04-20 (×2): qty 1

## 2018-04-20 MED ORDER — LACTATED RINGERS IV SOLN
INTRAVENOUS | Status: DC
  Administered 2018-04-20 (×2): via INTRAVENOUS

## 2018-04-20 MED ORDER — METHYLENE BLUE 0.5 % INJ SOLN
INTRAVENOUS | Status: AC
  Filled 2018-04-20: qty 10

## 2018-04-20 MED ORDER — 0.9 % SODIUM CHLORIDE (POUR BTL) OPTIME
TOPICAL | Status: DC | PRN
  Administered 2018-04-20: 1000 mL

## 2018-04-20 MED ORDER — FENTANYL CITRATE (PF) 100 MCG/2ML IJ SOLN
INTRAMUSCULAR | Status: DC | PRN
  Administered 2018-04-20: 50 ug via INTRAVENOUS

## 2018-04-20 MED ORDER — PROMETHAZINE HCL 25 MG/ML IJ SOLN: 6.2500 mg | INTRAMUSCULAR | Status: DC | PRN

## 2018-04-20 MED ORDER — SODIUM CHLORIDE (PF) 0.9 % IJ SOLN
INTRAVENOUS | Status: DC | PRN
  Administered 2018-04-20: 17:00:00 via INTRAMUSCULAR

## 2018-04-20 MED ORDER — ONDANSETRON HCL 4 MG/2ML IJ SOLN: 4.0000 mg | Freq: Four times a day (QID) | INTRAMUSCULAR | Status: DC | PRN

## 2018-04-20 MED ORDER — MEPERIDINE HCL 50 MG/ML IJ SOLN: 6.2500 mg | INTRAMUSCULAR | Status: DC | PRN

## 2018-04-20 MED ORDER — FENTANYL CITRATE (PF) 100 MCG/2ML IJ SOLN
INTRAMUSCULAR | Status: AC
  Administered 2018-04-20: 100 ug via INTRAVENOUS
  Filled 2018-04-20: qty 2

## 2018-04-20 MED ORDER — LIDOCAINE 2% (20 MG/ML) 5 ML SYRINGE
INTRAMUSCULAR | Status: AC
  Filled 2018-04-20: qty 5

## 2018-04-20 MED ORDER — KETOROLAC TROMETHAMINE 30 MG/ML IJ SOLN
30.0000 mg | Freq: Once | INTRAMUSCULAR | Status: AC | PRN
  Administered 2018-04-20: 30 mg via INTRAVENOUS

## 2018-04-20 MED ORDER — AMITRIPTYLINE HCL 25 MG PO TABS: 25.0000 mg | ORAL_TABLET | Freq: Every evening | ORAL | Status: DC | PRN

## 2018-04-20 MED ORDER — ONDANSETRON HCL 4 MG/2ML IJ SOLN
INTRAMUSCULAR | Status: AC
  Filled 2018-04-20: qty 4

## 2018-04-20 MED ORDER — ACETAMINOPHEN 500 MG PO TABS
1000.0000 mg | ORAL_TABLET | ORAL | Status: AC
  Administered 2018-04-20: 1000 mg via ORAL
  Filled 2018-04-20: qty 2

## 2018-04-20 MED ORDER — PHENYLEPHRINE HCL 10 MG/ML IJ SOLN
INTRAMUSCULAR | Status: DC | PRN
  Administered 2018-04-20: 120 ug via INTRAVENOUS
  Administered 2018-04-20: 160 ug via INTRAVENOUS

## 2018-04-20 MED ORDER — TECHNETIUM TC 99M SULFUR COLLOID FILTERED
1.0000 | Freq: Once | INTRAVENOUS | Status: AC | PRN
  Administered 2018-04-20: 1 via INTRADERMAL

## 2018-04-20 MED ORDER — KETOROLAC TROMETHAMINE 30 MG/ML IJ SOLN
INTRAMUSCULAR | Status: AC
  Administered 2018-04-20: 30 mg via INTRAVENOUS
  Filled 2018-04-20: qty 1

## 2018-04-20 MED ORDER — DEXAMETHASONE SODIUM PHOSPHATE 10 MG/ML IJ SOLN
INTRAMUSCULAR | Status: DC | PRN
  Administered 2018-04-20: 10 mg via INTRAVENOUS

## 2018-04-20 MED ORDER — METHOCARBAMOL 500 MG PO TABS: 500.0000 mg | ORAL_TABLET | Freq: Four times a day (QID) | ORAL | Status: DC | PRN

## 2018-04-20 MED ORDER — ONDANSETRON 4 MG PO TBDP: 4.0000 mg | ORAL_TABLET | Freq: Four times a day (QID) | ORAL | Status: DC | PRN

## 2018-04-20 MED ORDER — GABAPENTIN 300 MG PO CAPS
300.0000 mg | ORAL_CAPSULE | Freq: Two times a day (BID) | ORAL | Status: DC
  Administered 2018-04-20 – 2018-04-21 (×2): 300 mg via ORAL
  Filled 2018-04-20 (×2): qty 1

## 2018-04-20 MED ORDER — OXYCODONE HCL 5 MG PO TABS: 5.0000 mg | ORAL_TABLET | ORAL | Status: DC | PRN

## 2018-04-20 MED ORDER — PHENYLEPHRINE 40 MCG/ML (10ML) SYRINGE FOR IV PUSH (FOR BLOOD PRESSURE SUPPORT)
PREFILLED_SYRINGE | INTRAVENOUS | Status: AC
  Filled 2018-04-20: qty 20

## 2018-04-20 MED ORDER — GABAPENTIN 300 MG PO CAPS
300.0000 mg | ORAL_CAPSULE | ORAL | Status: AC
  Administered 2018-04-20: 300 mg via ORAL
  Filled 2018-04-20: qty 1

## 2018-04-20 SURGICAL SUPPLY — 54 items
ADH SKN CLS APL DERMABOND .7 (GAUZE/BANDAGES/DRESSINGS) ×1
BINDER BREAST LRG (GAUZE/BANDAGES/DRESSINGS) ×1 IMPLANT
BIOPATCH RED 1 DISK 7.0 (GAUZE/BANDAGES/DRESSINGS) ×1 IMPLANT
CANISTER SUCT 3000ML PPV (MISCELLANEOUS) ×4 IMPLANT
CHLORAPREP W/TINT 26ML (MISCELLANEOUS) ×2 IMPLANT
CLIP VESOCCLUDE MED 6/CT (CLIP) ×2 IMPLANT
COVER PROBE W GEL 5X96 (DRAPES) ×2 IMPLANT
COVER SURGICAL LIGHT HANDLE (MISCELLANEOUS) ×2 IMPLANT
DERMABOND ADVANCED (GAUZE/BANDAGES/DRESSINGS) ×1
DERMABOND ADVANCED .7 DNX12 (GAUZE/BANDAGES/DRESSINGS) ×1 IMPLANT
DEVICE DISSECT PLASMABLAD 3.0S (MISCELLANEOUS) ×1 IMPLANT
DRAIN CHANNEL 19F RND (DRAIN) ×2 IMPLANT
DRAPE CHEST BREAST 15X10 FENES (DRAPES) ×2 IMPLANT
DRAPE SURG 17X23 STRL (DRAPES) ×8 IMPLANT
DRSG TEGADERM 4X4.75 (GAUZE/BANDAGES/DRESSINGS) ×1 IMPLANT
ELECT CAUTERY BLADE 6.4 (BLADE) ×2 IMPLANT
ELECT REM PT RETURN 9FT ADLT (ELECTROSURGICAL) ×4
ELECTRODE REM PT RTRN 9FT ADLT (ELECTROSURGICAL) ×2 IMPLANT
EVACUATOR SILICONE 100CC (DRAIN) ×2 IMPLANT
GAUZE SPONGE 4X4 12PLY STRL (GAUZE/BANDAGES/DRESSINGS) ×1 IMPLANT
GLOVE BIO SURGEON STRL SZ7.5 (GLOVE) ×1 IMPLANT
GLOVE BIOGEL PI IND STRL 6.5 (GLOVE) IMPLANT
GLOVE BIOGEL PI IND STRL 7.5 (GLOVE) IMPLANT
GLOVE BIOGEL PI IND STRL 8 (GLOVE) ×1 IMPLANT
GLOVE BIOGEL PI INDICATOR 6.5 (GLOVE) ×2
GLOVE BIOGEL PI INDICATOR 7.5 (GLOVE) ×1
GLOVE BIOGEL PI INDICATOR 8 (GLOVE) ×1
GLOVE ECLIPSE 7.5 STRL STRAW (GLOVE) ×2 IMPLANT
GLOVE SURG SS PI 6.5 STRL IVOR (GLOVE) ×3 IMPLANT
GOWN STRL REUS W/ TWL LRG LVL3 (GOWN DISPOSABLE) ×1 IMPLANT
GOWN STRL REUS W/ TWL XL LVL3 (GOWN DISPOSABLE) ×1 IMPLANT
GOWN STRL REUS W/TWL LRG LVL3 (GOWN DISPOSABLE) ×2
GOWN STRL REUS W/TWL XL LVL3 (GOWN DISPOSABLE) ×2
KIT BASIN OR (CUSTOM PROCEDURE TRAY) ×2 IMPLANT
KIT TURNOVER KIT B (KITS) ×2 IMPLANT
NDL 18GX1X1/2 (RX/OR ONLY) (NEEDLE) ×1 IMPLANT
NDL BLUNT 16X1.5 OR ONLY (NEEDLE) IMPLANT
NDL HYPO 25GX1X1/2 BEV (NEEDLE) ×1 IMPLANT
NEEDLE 18GX1X1/2 (RX/OR ONLY) (NEEDLE) ×2 IMPLANT
NEEDLE BLUNT 16X1.5 OR ONLY (NEEDLE) ×2 IMPLANT
NEEDLE HYPO 25GX1X1/2 BEV (NEEDLE) ×2 IMPLANT
NS IRRIG 1000ML POUR BTL (IV SOLUTION) ×2 IMPLANT
PACK GENERAL/GYN (CUSTOM PROCEDURE TRAY) ×2 IMPLANT
PAD ABD 8X10 STRL (GAUZE/BANDAGES/DRESSINGS) ×1 IMPLANT
PAD ARMBOARD 7.5X6 YLW CONV (MISCELLANEOUS) ×2 IMPLANT
PLASMABLADE 3.0S (MISCELLANEOUS) ×2
SPECIMEN JAR X LARGE (MISCELLANEOUS) ×2 IMPLANT
SUT ETHILON 2 0 FS 18 (SUTURE) ×2 IMPLANT
SUT MNCRL AB 4-0 PS2 18 (SUTURE) ×3 IMPLANT
SUT VIC AB 3-0 54X BRD REEL (SUTURE) ×1 IMPLANT
SUT VIC AB 3-0 BRD 54 (SUTURE) ×2
SUT VIC AB 3-0 SH 18 (SUTURE) ×3 IMPLANT
SYR CONTROL 10ML LL (SYRINGE) ×2 IMPLANT
TUBE CONNECTING 12X1/4 (SUCTIONS) ×2 IMPLANT

## 2018-04-20 NOTE — Transfer of Care (Signed)
Immediate Anesthesia Transfer of Care Note  Patient: Jodi Gutierrez  Procedure(s) Performed: RIGHT TOTAL MASTECTOMY WITH AXILLARY SENTINEL LYMPH NODE BIOPSY (Right Breast)  Patient Location: PACU  Anesthesia Type:GA combined with regional for post-op pain  Level of Consciousness: awake and patient cooperative  Airway & Oxygen Therapy: Patient Spontanous Breathing  Post-op Assessment: Report given to RN and Post -op Vital signs reviewed and stable  Post vital signs: Reviewed and stable  Last Vitals:  Vitals Value Taken Time  BP 139/58 04/20/2018  5:15 PM  Temp 36.2 C 04/20/2018  5:15 PM  Pulse 97 04/20/2018  5:16 PM  Resp 15 04/20/2018  5:16 PM  SpO2 100 % 04/20/2018  5:16 PM  Vitals shown include unvalidated device data.  Last Pain:  Vitals:   04/20/18 1500  TempSrc:   PainSc: 0-No pain         Complications: No apparent anesthesia complications

## 2018-04-20 NOTE — Anesthesia Preprocedure Evaluation (Signed)
Anesthesia Evaluation  Patient identified by MRN, date of birth, ID band Patient awake    Reviewed: Allergy & Precautions, NPO status , Patient's Chart, lab work & pertinent test results  Airway Mallampati: I       Dental no notable dental hx. (+) Teeth Intact   Pulmonary neg pulmonary ROS,    Pulmonary exam normal breath sounds clear to auscultation       Cardiovascular negative cardio ROS Normal cardiovascular exam Rhythm:Regular Rate:Normal     Neuro/Psych PSYCHIATRIC DISORDERS Anxiety    GI/Hepatic negative GI ROS, Neg liver ROS,   Endo/Other  negative endocrine ROS  Renal/GU negative Renal ROS     Musculoskeletal   Abdominal Normal abdominal exam  (+)   Peds  Hematology negative hematology ROS (+)   Anesthesia Other Findings   Reproductive/Obstetrics                             Anesthesia Physical Anesthesia Plan  ASA: II  Anesthesia Plan: General   Post-op Pain Management:  Regional for Post-op pain   Induction:   PONV Risk Score and Plan: 3 and Ondansetron, Dexamethasone and Midazolam  Airway Management Planned: LMA  Additional Equipment:   Intra-op Plan:   Post-operative Plan: Extubation in OR  Informed Consent: I have reviewed the patients History and Physical, chart, labs and discussed the procedure including the risks, benefits and alternatives for the proposed anesthesia with the patient or authorized representative who has indicated his/her understanding and acceptance.   Dental advisory given  Plan Discussed with: CRNA and Surgeon  Anesthesia Plan Comments:         Anesthesia Quick Evaluation

## 2018-04-20 NOTE — Anesthesia Procedure Notes (Signed)
Procedure Name: LMA Insertion Date/Time: 04/20/2018 3:41 PM Performed by: Lance Coon, CRNA Pre-anesthesia Checklist: Patient identified, Emergency Drugs available, Suction available, Patient being monitored and Timeout performed Patient Re-evaluated:Patient Re-evaluated prior to induction Oxygen Delivery Method: Circle system utilized Preoxygenation: Pre-oxygenation with 100% oxygen Induction Type: IV induction LMA: LMA inserted LMA Size: 4.0 Number of attempts: 1 Placement Confirmation: positive ETCO2 and breath sounds checked- equal and bilateral Tube secured with: Tape Dental Injury: Teeth and Oropharynx as per pre-operative assessment

## 2018-04-20 NOTE — Op Note (Signed)
Preoperative Diagnosis: RIGHT BREAST CANCER  Postoprative Diagnosis: RIGHT BREAST CANCER  Procedure: Procedure(s): RIGHT TOTAL MASTECTOMY WITH AXILLARY SENTINEL LYMPH NODE BIOPSY   Surgeon: Excell Seltzer T   Assistants: Sharyn Dross, RNFA  Anesthesia:  General LMA anesthesia  Indications: 67 year old female with a new diagnosis of cancer of the right breast, upper outer quadrant. Clinical stage 0, high-grade, ER/PR positive. Now a second area of biopsy-proven low-grade DCIS retroareolar and a third area of abnormality on MRI not biopsied.  Microinvasion could not be ruled out.  After extensive discussion regarding options and risks detailed elsewhere we have elected to proceed with right total mastectomy and axillary sentinel lymph node biopsy as initial surgical therapy.  She has declined immediate reconstruction.    Procedure Detail: Patient was brought to the operating room, placed in supine position on the operating table, and laryngeal mask general anesthesia induced.  She had undergone a pectoral block by anesthesia and had undergone injection of 1 mCi of technetium sulfur colloid intradermally around the right nipple preoperatively.  The entire right breast, anterior chest, neck and axilla and upper arm were widely sterilely prepped and draped.  She received preoperative IV antibiotics.  Patient timeout was performed and correct procedure verified. A transversely oriented elliptical incision was used encompassing the nipple areolar complex using the plasma blade.  Using the plasma blade skin and subcutaneous flaps were then raised superiorly toward the clavicle, medially to the edge of the sternum, inferiorly to the inframammary crease and laterally out to the anterior border of the latissimus which was defined and dissected.  Dissection was deepened down to the chest wall.  The breast was then reflected off the chest wall working medial to lateral with the plasma blade.  Intercostal  vessel was clamped divided and tied.  The dissection progressed laterally and the specimen was freed from the edge of the pectoralis major and the serratus and from the anterior border of the lower latissimus working up toward the axilla until the specimen was completely freed down to the low axilla.  Then using the neoprobe for guidance I was able to identify 2 hot normal-appearing lymph nodes in the axilla which were each excised and sent as sentinel lymph nodes.  These had counts in the 3-500 range.  At this point there was no palpable axillary adenopathy in background in the axilla was less than 50.  I then came across the low axilla with Kelly clamps tying with 3-0 Vicryl and the specimen was removed oriented and sent for permanent pathology.  Complete hemostasis was assured.  A 19 Blake closed suction drain was brought out through a separate stab wound and left under both flaps.  The subtenons tissue was closed with interrupted 3-0 Vicryl and the skin was closed with running subcuticular 4-0 Monocryl and Dermabond.  Sponge needle and instrument counts were correct.    Findings: As above  Estimated Blood Loss:  less than 50 mL         Drains: Around 19 Blake drain  Blood Given: none          Specimens: #1 right total mastectomy    #2 right axillary sentinel lymph nodes X 2        Complications:  * No complications entered in OR log *         Disposition: PACU - hemodynamically stable.         Condition: stable

## 2018-04-20 NOTE — Anesthesia Postprocedure Evaluation (Signed)
Anesthesia Post Note  Patient: Jodi Gutierrez  Procedure(s) Performed: RIGHT TOTAL MASTECTOMY WITH AXILLARY SENTINEL LYMPH NODE BIOPSY (Right Breast)     Patient location during evaluation: PACU Anesthesia Type: General Level of consciousness: sedated Pain management: pain level controlled Vital Signs Assessment: post-procedure vital signs reviewed and stable Respiratory status: spontaneous breathing Cardiovascular status: stable Postop Assessment: no apparent nausea or vomiting Anesthetic complications: no    Last Vitals:  Vitals:   04/20/18 1837 04/20/18 1902  BP: (!) 142/82 (!) 149/80  Pulse: 99 98  Resp: 12 18  Temp: (!) 36.2 C   SpO2: 93% 98%    Last Pain:  Vitals:   04/20/18 2043  TempSrc:   PainSc: 2    Pain Goal:                 Huston Foley

## 2018-04-20 NOTE — Discharge Instructions (Signed)
[facility] °[phone] ° °MASTECTOMY: POST OP INSTRUCTIONS ° °Always review your discharge instruction sheet given to you by the facility where your surgery was performed. °IF YOU HAVE DISABILITY OR FAMILY LEAVE FORMS, YOU MUST BRING THEM TO THE OFFICE FOR PROCESSING.   °DO NOT GIVE THEM TO YOUR DOCTOR. °A prescription for pain medication may be given to you upon discharge.  Take your pain medication as prescribed, if needed.  If narcotic pain medicine is not needed, then you may take acetaminophen (Tylenol) or ibuprofen (Advil) as needed. °1. Take your usually prescribed medications unless otherwise directed. °2. If you need a refill on your pain medication, please contact your pharmacy.  They will contact our office to request authorization.  Prescriptions will not be filled after 5pm or on week-ends. °3. You should follow a light diet the first few days after arrival home, such as soup and crackers, etc.  Resume your normal diet the day after surgery. °4. Most patients will experience some swelling and bruising on the chest and underarm.  Ice packs will help.  Swelling and bruising can take several days to resolve.  °5. It is common to experience some constipation if taking pain medication after surgery.  Increasing fluid intake and taking a stool softener (such as Colace) will usually help or prevent this problem from occurring.  A mild laxative (Milk of Magnesia or Miralax) should be taken according to package instructions if there are no bowel movements after 48 hours. °6. Unless discharge instructions indicate otherwise, leave your bandage dry and in place until your next appointment in 3-5 days.  You may take a limited sponge bath.  No tube baths or showers until the drains are removed.  You may have steri-strips (small skin tapes) in place directly over the incision.  These strips should be left on the skin for 7-10 days.  If your surgeon used skin glue on the incision, you may  shower in 24 hours.  The glue will flake off over the next 2-3 weeks.  Any sutures or staples will be removed at the office during your follow-up visit. °7. DRAINS:  If you have drains in place, it is important to keep a list of the amount of drainage produced each day in your drains.  Before leaving the hospital, you should be instructed on drain care.  Call our office if you have any questions about your drains. °8. ACTIVITIES:  You may resume regular (light) daily activities beginning the next day--such as daily self-care, walking, climbing stairs--gradually increasing activities as tolerated.  You may have sexual intercourse when it is comfortable.  Refrain from any heavy lifting or straining until approved by your doctor. °a. You may drive when you are no longer taking prescription pain medication, you can comfortably wear a seatbelt, and you can safely maneuver your car and apply brakes. °b. RETURN TO WORK:  __________________________________________________________ °9. You should see your doctor in the office for a follow-up appointment approximately 3-5 days after your surgery.  Your doctor’s nurse will typically make your follow-up appointment when she calls you with your pathology report.  Expect your pathology report 2-3 business days after your surgery.  You may call to check if you do not hear from us after three days.   °10. OTHER INSTRUCTIONS: ______________________________________________________________________________________________ ____________________________________________________________________________________________ °WHEN TO CALL YOUR DOCTOR: °1. Fever over 101.0 °2. Nausea and/or vomiting °3. Extreme swelling or bruising °4. Continued bleeding from incision. °5. Increased pain, redness, or drainage from the incision. °  The clinic staff is available to answer your questions during regular business hours.  Please don’t hesitate to call and ask to speak to one of the nurses for clinical  concerns.  If you have a medical emergency, go to the nearest emergency room or call 911.  A surgeon from [facility] is always on call at the hospital. °[address], [address], [place], [place]  [address] ? [address] [address], [place], [place]   [address] °[phone] ? [phone] ? [phone] [phone] °Web site: [email or web address] °

## 2018-04-20 NOTE — Anesthesia Procedure Notes (Signed)
Anesthesia Regional Block: Pectoralis block   Pre-Anesthetic Checklist: ,, timeout performed, Correct Patient, Correct Site, Correct Laterality, Correct Procedure, Correct Position, site marked, Risks and benefits discussed,  Surgical consent,  Pre-op evaluation,  At surgeon's request and post-op pain management  Laterality: Right  Prep: chloraprep       Needles:  Injection technique: Single-shot  Needle Type: Echogenic Stimulator Needle     Needle Length: 10cm  Needle Gauge: 21   Needle insertion depth: 2 cm   Additional Needles:   Procedures:,,,, ultrasound used (permanent image in chart),,,,  Narrative:  Start time: 04/20/2018 2:30 PM End time: 04/20/2018 2:50 PM Injection made incrementally with aspirations every 5 mL.  Performed by: Personally  Anesthesiologist: Lyn Hollingshead, MD

## 2018-04-21 ENCOUNTER — Other Ambulatory Visit: Payer: Self-pay

## 2018-04-21 ENCOUNTER — Encounter (HOSPITAL_COMMUNITY): Payer: Self-pay | Admitting: General Surgery

## 2018-04-21 DIAGNOSIS — C50411 Malignant neoplasm of upper-outer quadrant of right female breast: Secondary | ICD-10-CM | POA: Diagnosis not present

## 2018-04-21 LAB — CBC
HCT: 35.1 % — ABNORMAL LOW (ref 36.0–46.0)
Hemoglobin: 11.7 g/dL — ABNORMAL LOW (ref 12.0–15.0)
MCH: 31.2 pg (ref 26.0–34.0)
MCHC: 33.3 g/dL (ref 30.0–36.0)
MCV: 93.6 fL (ref 80.0–100.0)
NRBC: 0 % (ref 0.0–0.2)
PLATELETS: 265 10*3/uL (ref 150–400)
RBC: 3.75 MIL/uL — ABNORMAL LOW (ref 3.87–5.11)
RDW: 12.1 % (ref 11.5–15.5)
WBC: 10.5 10*3/uL (ref 4.0–10.5)

## 2018-04-21 MED ORDER — TRAMADOL HCL 50 MG PO TABS: 50.0000 mg | ORAL_TABLET | Freq: Four times a day (QID) | ORAL | 0 refills | Status: AC | PRN

## 2018-04-21 NOTE — Progress Notes (Signed)
Jodi Gutierrez to be D/C'd  per MD order. Discussed with the patient and all questions fully answered.  VSS, Skin clean, dry and intact without evidence of skin break down, no evidence of skin tears noted.  IV catheter discontinued intact. Site without signs and symptoms of complications. Dressing and pressure applied.  An After Visit Summary was printed and given to the patient. Patient received prescription.  D/c education completed with patient/family including follow up instructions, medication list, d/c activities limitations if indicated, with other d/c instructions as indicated by MD - patient able to verbalize understanding, all questions fully answered.   Patient instructed to return to ED, call 911, or call MD for any changes in condition.   Patient to be escorted via [place], and D/C home via private auto.

## 2018-04-21 NOTE — Progress Notes (Signed)
Patient Care Team: Deland Pretty, MD as PCP - General (Internal Medicine) Excell Seltzer, MD as Consulting Physician (General Surgery) Nicholas Lose, MD as Consulting Physician (Hematology and Oncology) Gery Pray, MD as Consulting Physician (Radiation Oncology)  DIAGNOSIS:    ICD-10-CM   1. Ductal carcinoma in situ (DCIS) of right breast D05.11     SUMMARY OF ONCOLOGIC HISTORY:   Ductal carcinoma in situ (DCIS) of right breast   03/09/2018 Initial Diagnosis    Ductal carcinoma in situ (DCIS) of right breast    04/20/2018 Surgery    Right mastectomy: DCIS 7 cm, margins negative, 0/2 lymph nodes negative, ER 100% strong, PR 95% strong Tis N0 stage 0     CHIEF COMPLIANT: Follow-up of right DCIS after right mastectomy  INTERVAL HISTORY: Jodi Gutierrez is a 67 y.o. with above-mentioned history of right breast DCIS. She had a right total mastectomy on 04/20/18 that showed the 7cm tumor to be DCIS with clean margins. Both lymph nodes were negative for malignancy. She presents to the clinic today with her husband and notes tenderness and pain at the surgical site and normal drainage. The patient and her husband noted concern about side effects of Tamoxifen therapy, questioned about her need for yearly pap smears, and about her need for mammograms.  REVIEW OF SYSTEMS:   Constitutional: Denies fevers, chills or abnormal weight loss Eyes: Denies blurriness of vision Ears, nose, mouth, throat, and face: Denies mucositis or sore throat Respiratory: Denies cough, dyspnea or wheezes Cardiovascular: Denies palpitation, chest discomfort Gastrointestinal:  Denies nausea, heartburn or change in bowel habits Skin: Denies abnormal skin rashes Lymphatics: Denies new lymphadenopathy or easy bruising Neurological:Denies numbness, tingling or new weaknesses Behavioral/Psych: Mood is stable, no new changes  Extremities: Bilateral knee replacement surgery, fibromyalgia causing aches and  pains Breast: denies any pain or lumps or nodules in either breasts All other systems were reviewed with the patient and are negative.  I have reviewed the past medical history, past surgical history, social history and family history with the patient and they are unchanged from previous note.  ALLERGIES:  is allergic to silver; tape; nickel; and penicillins.  MEDICATIONS:  Current Outpatient Medications  Medication Sig Dispense Refill  . acetaminophen (TYLENOL) 500 MG tablet Take 2 tablets (1,000 mg total) by mouth every 8 (eight) hours. 30 tablet 0  . amitriptyline (ELAVIL) 25 MG tablet Take 25 mg by mouth at bedtime as needed for sleep.     . calcium carbonate (OSCAL) 1500 (600 Ca) MG TABS tablet Take 600 mg by mouth 2 (two) times daily with a meal.    . Cholecalciferol (VITAMIN D3) 5000 units TBDP Take 10,000 Units by mouth daily.     . diazepam (VALIUM) 5 MG tablet Take 5 mg by mouth at bedtime as needed for sedation.     . ferrous sulfate 325 (65 FE) MG tablet Take 1 tablet (325 mg total) by mouth 3 (three) times daily after meals. (Patient not taking: Reported on 10/27/2017)  3  . methocarbamol (ROBAXIN) 500 MG tablet Take 1 tablet (500 mg total) by mouth every 6 (six) hours as needed for muscle spasms. (Patient not taking: Reported on 03/11/2018) 20 tablet 0  . Multiple Vitamin (MULTIVITAMIN) tablet Take 1 tablet by mouth every morning.     . psyllium (METAMUCIL) 58.6 % packet Take 1 packet by mouth daily.    . traMADol (ULTRAM) 50 MG tablet Take 1 tablet (50 mg total) by mouth every 6 (six)  hours as needed. 20 tablet 0  . VITAMIN A PO Take 2,400 mcg by mouth daily.      Current Facility-Administered Medications  Medication Dose Route Frequency Provider Last Rate Last Dose  . 0.9 %  sodium chloride infusion  500 mL Intravenous Once Nandigam, Venia Minks, MD        PHYSICAL EXAMINATION: ECOG PERFORMANCE STATUS: 1 - Symptomatic but completely ambulatory  Vitals:   04/28/18 1418   BP: (!) 155/79  Pulse: (!) 101  Resp: 18  Temp: 97.9 F (36.6 C)  SpO2: 100%   Filed Weights   04/28/18 1418  Weight: 141 lb 4.8 oz (64.1 kg)    GENERAL:alert, no distress and comfortable SKIN: skin color, texture, turgor are normal, no rashes or significant lesions EYES: normal, Conjunctiva are pink and non-injected, sclera clear OROPHARYNX:no exudate, no erythema and lips, buccal mucosa, and tongue normal  NECK: supple, thyroid normal size, non-tender, without nodularity LYMPH:  no palpable lymphadenopathy in the cervical, axillary or inguinal LUNGS: clear to auscultation and percussion with normal breathing effort HEART: regular rate & rhythm and no murmurs and no lower extremity edema ABDOMEN:abdomen soft, non-tender and normal bowel sounds MUSCULOSKELETAL:no cyanosis of digits and no clubbing  NEURO: alert & oriented x 3 with fluent speech, no focal motor/sensory deficits EXTREMITIES: No lower extremity edema  LABORATORY DATA:  I have reviewed the data as listed CMP Latest Ref Rng & Units 04/16/2018 03/11/2018 10/27/2017  Glucose 70 - 99 mg/dL 110(H) 90 -  BUN 8 - 23 mg/dL 11 13 9   Creatinine 0.44 - 1.00 mg/dL 0.95 0.82 -  Sodium 135 - 145 mmol/L 139 144 -  Potassium 3.5 - 5.1 mmol/L 3.7 3.9 -  Chloride 98 - 111 mmol/L 104 104 -  CO2 22 - 32 mmol/L 28 28 -  Calcium 8.9 - 10.3 mg/dL 9.2 9.2 -  Total Protein 6.5 - 8.1 g/dL 6.8 6.9 -  Total Bilirubin 0.3 - 1.2 mg/dL 0.5 0.2(L) -  Alkaline Phos 38 - 126 U/L 72 84 -  AST 15 - 41 U/L 22 17 -  ALT 0 - 44 U/L 17 12 -    Lab Results  Component Value Date   WBC 10.5 04/21/2018   HGB 11.7 (L) 04/21/2018   HCT 35.1 (L) 04/21/2018   MCV 93.6 04/21/2018   PLT 265 04/21/2018   NEUTROABS 3.4 03/11/2018    ASSESSMENT & PLAN:  Ductal carcinoma in situ (DCIS) of right breast 04/20/2018: Right mastectomy: DCIS 7 cm, margins negative, 0/2 lymph nodes negative, ER 100% strong, PR 95% strong Tis N0 stage 0  Pathology review: I  discussed the final pathology report including the ER PR receptors and lymph node surgery.  Recommendation: Adjuvant antiestrogen therapy with tamoxifen 20 mg daily x5 years We discussed the time of 1 clinical trial which looked at 5 mg of tamoxifen and showed significant benefit. Patient is leaning on taking the tamoxifen at 5 mg dose.  She will call us back in a week to inform us of the decision. If she is takes 5 mg tamoxifen then we will need to see her back in 3 months for a follow-up and survivorship care plan visit.  If she calls back and decides not to take tamoxifen then we can see her back in 1 year.   No orders of the defined types were placed in this encounter.  The patient has a good understanding of the overall plan. she agrees with it. she will call  with any problems that may develop before the next visit here.  Nicholas Lose, MD 04/28/2018   I, Cloyde Reams Dorshimer, am acting as scribe for Nicholas Lose, MD.  I have reviewed the above documentation for accuracy and completeness, and I agree with the above.

## 2018-04-27 NOTE — Discharge Summary (Signed)
  Patient ID: Jodi Gutierrez [id number] 67 y.o. 16-Aug-1950  04/20/2018  Discharge date and time: 04/21/2018   Admitting Physician: Edward Jolly  Discharge Physician: Ralene Ok  Admission Diagnoses: RIGHT BREAST CANCER  Discharge Diagnoses: Same  Operations: Procedure(s): RIGHT TOTAL MASTECTOMY WITH AXILLARY SENTINEL LYMPH NODE BIOPSY  Admission Condition: good  Discharged Condition: good  Indication for Admission: 67 year old female with a new diagnosis of cancer of the right breast, upper outer quadrant. Clinical stage 0, high-grade, ER/PR positive. Now a second area of biopsy-proven low-grade DCIS retroareolar and a third area of abnormality on MRI not biopsied.  After extensive discussion detailed elsewhere we have elected to proceed with right total mastectomy and axillary sentinel lymph node biopsy as initial surgical therapy.  Hospital Course: On the morning of admission the patient underwent an uneventful right total mastectomy with axillary sentinel lymph node biopsy.  There were no complications.  The following morning her pain was well controlled.  Appropriate serosanguineous JP drainage and wound unremarkable.  She was felt ready for discharge.   Disposition: Home  Patient Instructions:  Allergies as of 04/21/2018      Reactions   Silver Other (See Comments)   swelling and blisters   Tape Rash, Other (See Comments)   Redness   Nickel    Blisters   Penicillins    Knots under arm Has patient had a PCN reaction causing immediate rash, facial/tongue/throat swelling, SOB or lightheadedness with hypotension:No Has patient had a PCN reaction causing severe rash involving mucus membranes or skin necrosis:No Has patient had a PCN reaction that required hospitalization:No Has patient had a PCN reaction occurring within the last 10 years:No If all of the above answers are "NO", then may proceed with Cephalosporin use.      Medication List    TAKE these  medications   acetaminophen 500 MG tablet Commonly known as:  TYLENOL Take 2 tablets (1,000 mg total) by mouth every 8 (eight) hours.   amitriptyline 25 MG tablet Commonly known as:  ELAVIL Take 25 mg by mouth at bedtime as needed for sleep.   calcium carbonate 1500 (600 Ca) MG Tabs tablet Commonly known as:  OSCAL Take 600 mg by mouth 2 (two) times daily with a meal.   diazepam 5 MG tablet Commonly known as:  VALIUM Take 5 mg by mouth at bedtime as needed for sedation.   ferrous sulfate 325 (65 FE) MG tablet Take 1 tablet (325 mg total) by mouth 3 (three) times daily after meals.   methocarbamol 500 MG tablet Commonly known as:  ROBAXIN Take 1 tablet (500 mg total) by mouth every 6 (six) hours as needed for muscle spasms.   multivitamin tablet Take 1 tablet by mouth every morning.   psyllium 58.6 % packet Commonly known as:  METAMUCIL Take 1 packet by mouth daily.   traMADol 50 MG tablet Commonly known as:  ULTRAM Take 1 tablet (50 mg total) by mouth every 6 (six) hours as needed.   VITAMIN A PO Take 2,400 mcg by mouth daily.   Vitamin D3 125 MCG (5000 UT) Tbdp Take 10,000 Units by mouth daily.       Activity: Limited use of right arm with no strenuous activity or heavy lifting Diet: regular diet Wound Care: Patient instructed to empty JP drain twice daily.  Follow-up:  With Dr. Excell Seltzer in 2 weeks.  Signed: Edward Jolly MD, FACS  04/27/2018, 12:35 PM

## 2018-04-28 ENCOUNTER — Inpatient Hospital Stay: Payer: 59 | Attending: Hematology and Oncology | Admitting: Hematology and Oncology

## 2018-04-28 DIAGNOSIS — Z9011 Acquired absence of right breast and nipple: Secondary | ICD-10-CM

## 2018-04-28 DIAGNOSIS — Z79899 Other long term (current) drug therapy: Secondary | ICD-10-CM

## 2018-04-28 DIAGNOSIS — Z79810 Long term (current) use of selective estrogen receptor modulators (SERMs): Secondary | ICD-10-CM

## 2018-04-28 DIAGNOSIS — D0511 Intraductal carcinoma in situ of right breast: Secondary | ICD-10-CM | POA: Insufficient documentation

## 2018-04-28 DIAGNOSIS — Z170 Estrogen receptor positive status [ER+]: Secondary | ICD-10-CM | POA: Diagnosis not present

## 2018-04-28 NOTE — Assessment & Plan Note (Signed)
04/20/2018: Right mastectomy: DCIS 7 cm, margins negative, 0/2 lymph nodes negative, ER 100% strong, PR 95% strong Tis N0 stage 0  Pathology review: I discussed the final pathology report including the ER PR receptors and lymph node surgery.  Recommendation: Adjuvant antiestrogen therapy with tamoxifen 20 mg daily x5 years  Return to clinic in 3 months for survivorship care plan

## 2018-04-29 ENCOUNTER — Telehealth: Payer: Self-pay | Admitting: Hematology and Oncology

## 2018-04-29 NOTE — Telephone Encounter (Signed)
No 12/3 los or referrals.  °

## 2018-05-04 ENCOUNTER — Telehealth: Payer: Self-pay

## 2018-05-04 ENCOUNTER — Telehealth: Payer: Self-pay | Admitting: Hematology and Oncology

## 2018-05-04 MED ORDER — TAMOXIFEN CITRATE 10 MG PO TABS: 10.0000 mg | ORAL_TABLET | Freq: Every day | ORAL | 0 refills | Status: DC

## 2018-05-04 NOTE — Telephone Encounter (Signed)
Scheduled appt per 12/9 sch message pt is aware of appt date and time   

## 2018-05-04 NOTE — Telephone Encounter (Signed)
Nurse returned call.  Patient voiced agreement to begin Tamoxifen 5mg  per Dr. Geralyn Flash recommendations.    Nurse sent medication to pharmacy.  Scheduling message sent to have patient f/u in 3 months.  Patient aware, and no further needs at this time.

## 2018-06-22 ENCOUNTER — Ambulatory Visit: Payer: 59 | Attending: General Surgery | Admitting: Physical Therapy

## 2018-06-22 ENCOUNTER — Other Ambulatory Visit: Payer: Self-pay

## 2018-06-22 ENCOUNTER — Encounter: Payer: Self-pay | Admitting: Physical Therapy

## 2018-06-22 DIAGNOSIS — R600 Localized edema: Secondary | ICD-10-CM | POA: Diagnosis present

## 2018-06-22 DIAGNOSIS — M25611 Stiffness of right shoulder, not elsewhere classified: Secondary | ICD-10-CM | POA: Insufficient documentation

## 2018-06-22 DIAGNOSIS — M6281 Muscle weakness (generalized): Secondary | ICD-10-CM | POA: Diagnosis present

## 2018-06-22 DIAGNOSIS — Z483 Aftercare following surgery for neoplasm: Secondary | ICD-10-CM | POA: Diagnosis present

## 2018-06-22 NOTE — Therapy (Signed)
[place], [place], [address] Phone: [phone]   Fax:  [phone]  Physical Therapy Evaluation  Patient Details  Name: Jodi Gutierrez MRN: [id number] Date of Birth: [birth year] Referring Provider (PT): Hoxworth   Encounter Date: 06/22/2018  PT End of Session - 06/22/18 1713    Visit Number  1    Number of Visits  9    Date for PT Re-Evaluation  07/20/18    PT Start Time  6811    PT Stop Time  1518    PT Time Calculation (min)  40 min    Activity Tolerance  Patient tolerated treatment well    Behavior During Therapy  [facility] for tasks assessed/performed       Past Medical History:  Diagnosis Date  . Anemia    hx of as child and early adult   . Arthritis   . Breast cancer in female [facility])    Right  . Claustrophobia   . Fibromyalgia   . H/O fibromyalgia     Past Surgical History:  Procedure Laterality Date  . BUNIONECTOMY     bil  . COLONOSCOPY    . FINGER SURGERY     right ring finger /1994  . FINGER SURGERY    . KNEE ARTHROSCOPY Right   . KNEE SURGERY     left / arthroscopic  . MASTECTOMY W/ SENTINEL NODE BIOPSY Right 04/20/2018   Procedure: RIGHT TOTAL MASTECTOMY WITH AXILLARY SENTINEL LYMPH NODE BIOPSY;  Surgeon: Excell Seltzer, MD;  Location: [facility];  Service: General;  Laterality: Right;  . TOTAL KNEE ARTHROPLASTY Right 02/15/2014   Procedure: RIGHT TOTAL KNEE ARTHROPLASTY;  Surgeon: Mauri Pole, MD;  Location: [facility];  Service: Orthopedics;  Laterality: Right;  . TOTAL KNEE ARTHROPLASTY Left 03/25/2016   Procedure: LEFT TOTAL KNEE ARTHROPLASTY;  Surgeon: Paralee Cancel, MD;  Location: [facility];  Service: Orthopedics;  Laterality: Left;  Adductor Block    There were no vitals filed for this visit.   Subjective Assessment - 06/22/18 1440    Subjective  I had a right mastectomy on April 20, 2018. My shoulder is improving. It feels like I have a baseball catchers mitt superglued  to my chest. It is tight and heavy. ROM has improved but it is not there. I can not sleep on the right side because of the pain.     Pertinent History  R DCIS with SLNB on 04/20/18, bilateral TKA (2 and 5 years ago), bunion surgery both toes, fibromyalgia, arthritis    Patient Stated Goals  to get back to where I was, stronger    Currently in Pain?  No/denies         [facility] PT Assessment - 06/22/18 0001      Assessment   Medical Diagnosis  right DCIS    Referring Provider (PT)  Hoxworth    Onset Date/Surgical Date  04/20/18    Hand Dominance  Right    Prior Therapy  none      Precautions   Precautions  Other (comment)    Precaution Comments  at risk for lymphedema      Restrictions   Weight Bearing Restrictions  No      Balance Screen   Has the patient fallen in the past 6 months  No    Has the patient had a decrease in activity level because of a fear of falling?   No    Is the patient reluctant  to leave their home because of a fear of falling?   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    Leisure  5 days a week, schwinn airdyne, variety of stretches and strengthening exercises, runs 45 min a day, Company secretary   Overall Cognitive Status  Within Functional Limits for tasks assessed      Observation/Other Assessments   Observations  edema right lateral trunk and inferior to mastectomy scar, mastectomy scar very tight      ROM / Strength   AROM / PROM / Strength  AROM      AROM   AROM Assessment Site  Shoulder    Right/Left Shoulder  Right;Left    Right Shoulder Flexion  156 Degrees    Right Shoulder ABduction  177 Degrees    Right Shoulder  Internal Rotation  65 Degrees    Right Shoulder External Rotation  82 Degrees    Left Shoulder Flexion  164 Degrees    Left Shoulder ABduction  175 Degrees    Left Shoulder Internal Rotation  60 Degrees    Left Shoulder External Rotation  90 Degrees        LYMPHEDEMA/ONCOLOGY QUESTIONNAIRE - 06/22/18 1452      Type   Cancer Type  right DCIS      Surgeries   Mastectomy Date  04/20/18    Sentinel Lymph Node Biopsy Date  04/20/18    Number Lymph Nodes Removed  2      Treatment   Active Chemotherapy Treatment  No    Past Chemotherapy Treatment  No    Active Radiation Treatment  No    Past Radiation Treatment  No    Current Hormone Treatment  Yes    Drug Name  Tamoxifen    Past Hormone Therapy  No      What other symptoms do you have   Are you Having Heaviness or Tightness  Yes    Are you having Pain  Yes   with movement   Are you having pitting edema  No    Is it Hard or Difficult finding clothes that fit  No    Do you have infections  No    Is there Decreased scar mobility  Yes      Lymphedema Assessments   Lymphedema Assessments  Upper extremities      Right Upper Extremity Lymphedema   15 cm Proximal to Olecranon Process  25 cm    Olecranon Process  23.3 cm    15 cm Proximal to Ulnar Styloid Process  21 cm    Just Proximal to Ulnar Styloid Process  14.7 cm    Across Hand at [employer]  19 cm    At [place] of 2nd Digit  6.3 cm      Left Upper Extremity Lymphedema   15 cm Proximal to Olecranon Process  25.2 cm    Olecranon Process  23.3 cm    15 cm Proximal to Ulnar Styloid Process  21.5 cm    Just Proximal  to Ulnar Styloid Process  14.8 cm    Across Hand at [employer]  19 cm    At [place] of 2nd Digit  6 cm          Quick Dash - 06/22/18 0001    Open a tight or new jar  No difficulty    Do heavy household chores (wash walls, wash floors)  Mild difficulty    Carry a shopping bag or briefcase  No difficulty    Wash your back  No difficulty    Use a  knife to cut food  No difficulty    Recreational activities in which you take some force or impact through your arm, shoulder, or hand (golf, hammering, tennis)  Mild difficulty    During the past week, to what extent has your arm, shoulder or hand problem interfered with your normal social activities with family, friends, neighbors, or groups?  Slightly    During the past week, to what extent has your arm, shoulder or hand problem limited your work or other regular daily activities  Slightly    Arm, shoulder, or hand pain.  Mild    Tingling (pins and needles) in your arm, shoulder, or hand  None    Difficulty Sleeping  Mild difficulty    DASH Score  13.64 %        Objective measurements completed on examination: See above findings.              PT Education - 06/22/18 1712    Education Details  anatomy and physiology of lymphatic system, lymphedema risk reduction practices, ABC class flyer    Person(s) Educated  Patient    Methods  Handout;Explanation    Comprehension  Verbalized understanding          PT Long Term Goals - 06/22/18 1720      PT LONG TERM GOAL #1   Title  Pt will demonstrate 165 degrees of R shoulder flexion to allow her to return to PLOF    Baseline  156    Time  4    Period  Weeks    Status  New    Target Date  07/20/18      PT LONG TERM GOAL #2   Title  Pt will be independent in a home exercise program for continued strengthening and stretching    Time  4    Period  Weeks    Status  New    Target Date  07/20/18      PT LONG TERM GOAL #3   Title  Pt will report a 50% improvement in swelling in right lateral trunk and inferior to mastectomy scar to allow improved comfort    Time  4    Period  Weeks    Status  New    Target Date  07/20/18      PT LONG TERM GOAL #4   Title  Pt will report a 75% improvement in tightness across chest with UE movements to allow pt to return to PLOF    Time  4    Period  Weeks    Status  New    Target Date   07/20/18             Plan - 06/22/18 1713    Clinical Impression Statement  Pt presents to PT with decreased right shoulder ROM and tightness across R chest after a mastectomy for R DCIS and SLNB on 04/20/18. Pt's ROM is  WFL but she does have tightness at end range especially with flexion. She has decreased scar mobility and edema along right lateral trunk and inferior to her mastectomy scar. 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 She would benefit from skilled PT services to increase R shoulder ROM, decrease R pec tightness, decrease post op edema and instruct pt in a home exercise program.    History and Personal Factors relevant to plan of care:  Pt is right handed, recent surgery, fibromyalgia    Clinical Presentation  Stable    Clinical Decision Making  Low    Rehab Potential  Excellent    PT Frequency  2x / week    PT Duration  4 weeks    PT Treatment/Interventions  ADLs/Self Care Home Management;Therapeutic activities;Therapeutic exercise;Patient/family education;Manual techniques;Manual lymph drainage;Compression bandaging;Scar mobilization;Passive range of motion;Taping    PT Next Visit Plan  begin MLD to right lateral trunk and area inferior to mastectomy scar, R shoulder ROM at end range flexion, scar mobilization, pec stretches, strength ABC program    Consulted and Agree with Plan of Care  Patient       Patient will benefit from skilled therapeutic intervention in order to improve the following deficits and impairments:  Increased fascial restricitons, Pain, Decreased scar mobility, Decreased range of motion, Decreased strength, Decreased knowledge of precautions, Increased edema  Visit Diagnosis: Stiffness of right shoulder, not elsewhere classified  Localized edema  Aftercare following surgery for neoplasm  Muscle weakness (generalized)     Problem List Patient Active Problem List   Diagnosis Date Noted  .  Ductal carcinoma in situ (DCIS) of right breast 03/09/2018  . 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 Fibromyalgia 11/24/2011  . Lumbar degenerative disc disease 11/24/2011    Allyson Sabal [facility] 06/22/2018, 5:22 PM  [place], [place], [address] Phone: [phone]   Fax:  [phone]  Name: KRISTALYNN CODDINGTON MRN: [id number] Date of Birth: 1950-07-10  Manus Gunning, PT 06/22/18 5:23 PM

## 2018-06-24 ENCOUNTER — Ambulatory Visit: Payer: 59

## 2018-06-24 DIAGNOSIS — M25611 Stiffness of right shoulder, not elsewhere classified: Secondary | ICD-10-CM | POA: Diagnosis not present

## 2018-06-24 DIAGNOSIS — Z483 Aftercare following surgery for neoplasm: Secondary | ICD-10-CM

## 2018-06-24 DIAGNOSIS — M6281 Muscle weakness (generalized): Secondary | ICD-10-CM

## 2018-06-24 DIAGNOSIS — R600 Localized edema: Secondary | ICD-10-CM

## 2018-06-24 NOTE — Therapy (Signed)
[place], [place], [address] Phone: [phone]   Fax:  [phone]  Physical Therapy Treatment  Patient Details  Name: Jodi Gutierrez MRN: [id number] Date of Birth: Nov 18, 1950 Referring Provider (PT): Hoxworth   Encounter Date: 06/24/2018  PT End of Session - 06/24/18 0932    Visit Number  2    Number of Visits  9    Date for PT Re-Evaluation  07/20/18    PT Start Time  0846    PT Stop Time  0931    PT Time Calculation (min)  45 min    Activity Tolerance  Patient tolerated treatment well    Behavior During Therapy  [facility] for tasks assessed/performed       Past Medical History:  Diagnosis Date  . Anemia    hx of as child and early adult   . Arthritis   . Breast cancer in female [facility])    Right  . Claustrophobia   . Fibromyalgia   . H/O fibromyalgia     Past Surgical History:  Procedure Laterality Date  . BUNIONECTOMY     bil  . COLONOSCOPY    . FINGER SURGERY     right ring finger /1994  . FINGER SURGERY    . KNEE ARTHROSCOPY Right   . KNEE SURGERY     left / arthroscopic  . MASTECTOMY W/ SENTINEL NODE BIOPSY Right 04/20/2018   Procedure: RIGHT TOTAL MASTECTOMY WITH AXILLARY SENTINEL LYMPH NODE BIOPSY;  Surgeon: Excell Seltzer, MD;  Location: [place];  Service: General;  Laterality: Right;  . TOTAL KNEE ARTHROPLASTY Right 02/15/2014   Procedure: RIGHT TOTAL KNEE ARTHROPLASTY;  Surgeon: Mauri Pole, MD;  Location: [facility];  Service: Orthopedics;  Laterality: Right;  . TOTAL KNEE ARTHROPLASTY Left 03/25/2016   Procedure: LEFT TOTAL KNEE ARTHROPLASTY;  Surgeon: Paralee Cancel, MD;  Location: [facility];  Service: Orthopedics;  Laterality: Left;  Adductor Block    There were no vitals filed for this visit.  Subjective Assessment - 06/24/18 0849    Subjective  Ready to get the tightness out of my Rt upper quadrant.     Pertinent History  R DCIS with SLNB on 04/20/18, bilateral TKA (2 and 5  years ago), bunion surgery both toes, fibromyalgia, arthritis    Patient Stated Goals  to get back to where I was, stronger    Currently in Pain?  No/denies                       [facility] Adult PT Treatment/Exercise - 06/24/18 0001      Manual Therapy   Manual Therapy  Myofascial release;Manual Lymphatic Drainage (MLD);Passive ROM    Myofascial Release  To Rt axilla and chest wall; also UE pulling throughout P/ROM    Manual Lymphatic Drainage (MLD)  In Supine: Short neck, superficial and deep abdominals, Lt axillary nodes and Rt inguinal nodes, then anterior inter-axillary and Rt axillo-inguinal anastomosis and focused on rt chest wall, especially inferior to incision    Passive ROM  In Supine to Rt shoulder into flexion, abduction, D2 all to pts end ROM                  PT Long Term Goals - 06/22/18 1720      PT LONG TERM GOAL #1   Title  Pt will demonstrate 165 degrees of R shoulder flexion to allow her to return to PLOF  Baseline  156    Time  4    Period  Weeks    Status  New    Target Date  07/20/18      PT LONG TERM GOAL #2   Title  Pt will be independent in a home exercise program for continued strengthening and stretching    Time  4    Period  Weeks    Status  New    Target Date  07/20/18      PT LONG TERM GOAL #3   Title  Pt will report a 50% improvement in swelling in right lateral trunk and inferior to mastectomy scar to allow improved comfort    Time  4    Period  Weeks    Status  New    Target Date  07/20/18      PT LONG TERM GOAL #4   Title  Pt will report a 75% improvement in tightness across chest with UE movements to allow pt to return to PLOF    Time  4    Period  Weeks    Status  New    Target Date  07/20/18            Plan - 06/24/18 0932    Clinical Impression Statement  First session today of manual therapy. Pt tolerated this very well and was very receptive to learning basics of anatomy of lymphatic system and  principles of manual lymph drainage. Also instructed her how to duplicate myofascial release performed today by stretching in doorway and using contralateral hand to provied stretch at Rt axilla and chest wall at end ROM. Pt reported feeling much looser by end of session and looks forward to upcoming sessions.     Rehab Potential  Excellent    PT Frequency  2x / week    PT Duration  4 weeks    PT Treatment/Interventions  ADLs/Self Care Home Management;Therapeutic activities;Therapeutic exercise;Patient/family education;Manual techniques;Manual lymph drainage;Compression bandaging;Scar mobilization;Passive range of motion;Taping    PT Next Visit Plan  Cont and instruct pt in MLD to right lateral trunk and area inferior to mastectomy scar, also issue handout for this, cont Rt shoulder ROM at end range flexion, scar mobilization, pec stretches, add supine scapular series (pt can probably do in standing against wall) and then later strength ABC program    Consulted and Agree with Plan of Care  Patient       Patient will benefit from skilled therapeutic intervention in order to improve the following deficits and impairments:  Increased fascial restricitons, Pain, Decreased scar mobility, Decreased range of motion, Decreased strength, Decreased knowledge of precautions, Increased edema  Visit Diagnosis: Stiffness of right shoulder, not elsewhere classified  Localized edema  Aftercare following surgery for neoplasm  Muscle weakness (generalized)     Problem List Patient Active Problem List   Diagnosis Date Noted  . Ductal carcinoma in situ (DCIS) of right breast 03/09/2018  . 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 Fibromyalgia 11/24/2011  . Lumbar degenerative disc disease 11/24/2011    Otelia Limes, PTA 06/24/2018, 9:37 AM  [place], [place], [address] Phone: [phone]   Fax:  [phone]  Name: Jodi Gutierrez MRN: [id number] Date of Birth: December 23, 1950

## 2018-06-29 ENCOUNTER — Encounter: Payer: 59 | Admitting: Rehabilitation

## 2018-07-01 ENCOUNTER — Ambulatory Visit: Payer: 59 | Attending: General Surgery | Admitting: Physical Therapy

## 2018-07-01 ENCOUNTER — Encounter: Payer: Self-pay | Admitting: Physical Therapy

## 2018-07-01 DIAGNOSIS — M6281 Muscle weakness (generalized): Secondary | ICD-10-CM | POA: Diagnosis present

## 2018-07-01 DIAGNOSIS — Z483 Aftercare following surgery for neoplasm: Secondary | ICD-10-CM | POA: Diagnosis present

## 2018-07-01 DIAGNOSIS — R600 Localized edema: Secondary | ICD-10-CM | POA: Diagnosis present

## 2018-07-01 DIAGNOSIS — M25611 Stiffness of right shoulder, not elsewhere classified: Secondary | ICD-10-CM | POA: Diagnosis present

## 2018-07-01 NOTE — Therapy (Signed)
[place], [place], [address] Phone: [phone]   Fax:  [phone]  Physical Therapy Treatment  Patient Details  Name: Jodi Gutierrez MRN: [id number] Date of Birth: January 25, 1951 Referring Provider (PT): Hoxworth   Encounter Date: 07/01/2018  PT End of Session - 07/01/18 1545    Visit Number  3    Number of Visits  9    Date for PT Re-Evaluation  07/20/18    PT Start Time  0805    PT Stop Time  0855    PT Time Calculation (min)  50 min    Activity Tolerance  Patient tolerated treatment well    Behavior During Therapy  [facility] for tasks assessed/performed       Past Medical History:  Diagnosis Date  . Anemia    hx of as child and early adult   . Arthritis   . Breast cancer in female [facility])    Right  . Claustrophobia   . Fibromyalgia   . H/O fibromyalgia     Past Surgical History:  Procedure Laterality Date  . BUNIONECTOMY     bil  . COLONOSCOPY    . FINGER SURGERY     right ring finger /1994  . FINGER SURGERY    . KNEE ARTHROSCOPY Right   . KNEE SURGERY     left / arthroscopic  . MASTECTOMY W/ SENTINEL NODE BIOPSY Right 04/20/2018   Procedure: RIGHT TOTAL MASTECTOMY WITH AXILLARY SENTINEL LYMPH NODE BIOPSY;  Surgeon: Excell Seltzer, MD;  Location: [place];  Service: General;  Laterality: Right;  . TOTAL KNEE ARTHROPLASTY Right 02/15/2014   Procedure: RIGHT TOTAL KNEE ARTHROPLASTY;  Surgeon: Mauri Pole, MD;  Location[facility];  Service: Orthopedics;  Laterality: Right;  . TOTAL KNEE ARTHROPLASTY Left 03/25/2016   Procedure: LEFT TOTAL KNEE ARTHROPLASTY;  Surgeon: Paralee Cancel, MD;  [facility];  Service: Orthopedics;  Laterality: Left;  Adductor Block    There were no vitals filed for this visit.  Subjective Assessment - 07/01/18 0808    Subjective  Pt states she felt much looser after Val worked on her last time, and it took a few days for the symptoms to return, but "its there"      Pertinent History  R DCIS with mastectomy SLNB on 04/20/18,no chemo or radiation  bilateral TKA (2 and 5 years ago), bunion surgery both toes, fibromyalgia, arthritis    Patient Stated Goals  to get back to where I was, stronger    Currently in Pain?  Yes    Pain Score  1     Pain Location  Chest    Pain Orientation  Right                       [facility] Adult PT Treatment/Exercise - 07/01/18 0001      Self-Care   Self-Care  Other Self-Care Comments    Other Self-Care Comments   showed pt samples of compression bra and compression camisole . She has a knitted knocker already       Manual Therapy   Manual Therapy  Edema management;Myofascial release;Manual Lymphatic Drainage (MLD)    Edema Management  explained physiology of lymph system to husband and gave hand over hand instruciton to him to perform technique     Myofascial Release  To Rt axilla and chest wall;     Manual Lymphatic Drainage (MLD)  In Supine: Short neck, superficial and deep  abdominals, Lt axillary nodes and Rt inguinal nodes, then anterior inter-axillary and Rt axillo-inguinal anastomosis and focused on rt chest wall, especially inferior to incision then to sidelying for lateral chest and back     Passive ROM  In Supine to Rt shoulder into flexion, abduction, D2 all to pts end ROM             PT Education - 07/01/18 1544    Education Details  manual lymph drainage and myofascial release technique     Person(s) Educated  Patient;Spouse    Methods  Explanation;Demonstration;Tactile cues;Verbal cues   klose DVD    Comprehension  Verbalized understanding;Returned demonstration          PT Long Term Goals - 06/22/18 1720      PT LONG TERM GOAL #1   Title  Pt will demonstrate 165 degrees of R shoulder flexion to allow her to return to PLOF    Baseline  156    Time  4    Period  Weeks    Status  New    Target Date  07/20/18      PT LONG TERM GOAL #2   Title  Pt will be independent in a  home exercise program for continued strengthening and stretching    Time  4    Period  Weeks    Status  New    Target Date  07/20/18      PT LONG TERM GOAL #3   Title  Pt will report a 50% improvement in swelling in right lateral trunk and inferior to mastectomy scar to allow improved comfort    Time  4    Period  Weeks    Status  New    Target Date  07/20/18      PT LONG TERM GOAL #4   Title  Pt will report a 75% improvement in tightness across chest with UE movements to allow pt to return to PLOF    Time  4    Period  Weeks    Status  New    Target Date  07/20/18            Plan - 07/01/18 1548    Clinical Impression Statement  Husband attended session today for instruction in manual lymph drainage and myofascia release technique.  Encouraged pt to do what she can reach for her chest as well.  Pt going to get a compression bra/cami today so showed her examples of what to get for good back and front of axilla compression coverage.  Pt stats she is doing arm raises 1 reps with 5 # so suggested that may be too much wieight for now that is conrtributing to her back and axillary swelling.  She said she would decrease to weight.  Pt needs more instruction in slow. resistance training for UE     PT Frequency  2x / week    PT Duration  4 weeks    PT Treatment/Interventions  ADLs/Self Care Home Management;Therapeutic activities;Therapeutic exercise;Patient/family education;Manual techniques;Manual lymph drainage;Compression bandaging;Scar mobilization;Passive range of motion;Taping    PT Next Visit Plan  Review and Cont and instruct pt in MLD to right lateral trunk and area inferior to mastectomy scar, also issue handout for this, cont Rt shoulder ROM at end range flexion, scar mobilization, pec stretches, add supine scapular series (pt can probably do in standing against wall) and then later strength ABC program    Consulted and Agree with Plan of Care  Patient       Patient will  benefit from skilled therapeutic intervention in order to improve the following deficits and impairments:  Increased fascial restricitons, Pain, Decreased scar mobility, Decreased range of motion, Decreased strength, Decreased knowledge of precautions, Increased edema  Visit Diagnosis: Stiffness of right shoulder, not elsewhere classified  Localized edema  Aftercare following surgery for neoplasm  Muscle weakness (generalized)     Problem List Patient Active Problem List   Diagnosis Date Noted  . Ductal carcinoma in situ (DCIS) of right breast 03/09/2018  . 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 Fibromyalgia 11/24/2011  . Lumbar degenerative disc disease 11/24/2011   Donato Heinz. Owens Shark PT  Norwood Levo 07/01/2018, 3:52 PM  [place], [place], [address] Phone: [phone]   Fax:  [phone]  Name: Jodi Gutierrez MRN: [id number] Date of Birth: Dec 19, 1950

## 2018-07-03 ENCOUNTER — Ambulatory Visit: Payer: 59 | Admitting: Rehabilitation

## 2018-07-06 ENCOUNTER — Encounter: Payer: Self-pay | Admitting: Physical Therapy

## 2018-07-06 ENCOUNTER — Other Ambulatory Visit: Payer: Self-pay

## 2018-07-06 ENCOUNTER — Ambulatory Visit: Payer: 59 | Admitting: Physical Therapy

## 2018-07-06 DIAGNOSIS — M25611 Stiffness of right shoulder, not elsewhere classified: Secondary | ICD-10-CM | POA: Diagnosis not present

## 2018-07-06 DIAGNOSIS — Z483 Aftercare following surgery for neoplasm: Secondary | ICD-10-CM

## 2018-07-06 DIAGNOSIS — R600 Localized edema: Secondary | ICD-10-CM

## 2018-07-06 NOTE — Patient Instructions (Signed)
Self manual lymph drainage: Perform this sequence once a day.  Only give enough pressure no your skin to make the skin move.  Diaphragmatic - Supine   Inhale through nose making navel move out toward hands. Exhale through puckered lips, hands follow navel in. Repeat _5__ times. Rest _10__ seconds between repeats.   Copyright  VHI. All rights reserved.  Hug yourself.  Do circles at your neck just above your collarbones.  Repeat this 10 times.  Axilla - One at a Time   Using full weight of flat hand and fingers at center of uninvolved armpit, make _10__ in-place circles.   Copyright  VHI. All rights reserved.  LEG: Inguinal Nodes Stimulation   With small finger side of hand against hip crease on involved side, gently perform circles at the crease. Repeat __10_ times.   Copyright  VHI. All rights reserved.  1) Axilla to Inguinal Nodes - Sweep   On involved side, sweep _4__ times from armpit along side of trunk to hip crease.  Now gently stretch skin from the involved side to the uninvolved side across the chest at the shoulder line.  Repeat that 4 times.  Draw an imaginary diagonal line from upper outer breast through the nipple area toward lower inner breast.  Direct fluid upward and inward from this line toward the pathway across your upper chest .  Do this in three rows to treat all of the upper inner breast tissue, and do each row 3-4x.      Direct fluid to treat all of lower outer breast tissue downward and outward toward pathway that is aimed at the right groin.  Finish by doing the pathways as described above going from your involved armpit to the same side groin and going across your upper chest from the involved shoulder to the uninvolved shoulder.  Repeat the steps above where you do circles in your right groin and left armpit. Copyright  VHI. All rights reserved.  

## 2018-07-06 NOTE — Therapy (Signed)
[place], [place], [address] Phone: [phone]   Fax:  [phone]  Physical Therapy Treatment  Patient Details  Name: Jodi Gutierrez MRN: [id number] Date of Birth: 1950-11-04 Referring Provider (PT): Hoxworth   Encounter Date: 07/06/2018  PT End of Session - 07/06/18 1707    Visit Number  4    Number of Visits  9    Date for PT Re-Evaluation  07/20/18    PT Start Time  7412    PT Stop Time  1602    PT Time Calculation (min)  47 min    Activity Tolerance  Patient tolerated treatment well    Behavior During Therapy  [facility] for tasks assessed/performed       Past Medical History:  Diagnosis Date  . Anemia    hx of as child and early adult   . Arthritis   . Breast cancer in female [facility])    Right  . Claustrophobia   . Fibromyalgia   . H/O fibromyalgia     Past Surgical History:  Procedure Laterality Date  . BUNIONECTOMY     bil  . COLONOSCOPY    . FINGER SURGERY     right ring finger /1994  . FINGER SURGERY    . KNEE ARTHROSCOPY Right   . KNEE SURGERY     left / arthroscopic  . MASTECTOMY W/ SENTINEL NODE BIOPSY Right 04/20/2018   Procedure: RIGHT TOTAL MASTECTOMY WITH AXILLARY SENTINEL LYMPH NODE BIOPSY;  Surgeon: Excell Seltzer, MD;  Location: [place];  Service: General;  Laterality: Right;  . TOTAL KNEE ARTHROPLASTY Right 02/15/2014   Procedure: RIGHT TOTAL KNEE ARTHROPLASTY;  Surgeon: Mauri Pole, MD;  [facility];  Service: Orthopedics;  Laterality: Right;  . TOTAL KNEE ARTHROPLASTY Left 03/25/2016   Procedure: LEFT TOTAL KNEE ARTHROPLASTY;  Surgeon: Paralee Cancel, MD;  [facility];  Service: Orthopedics;  Laterality: Left;  Adductor Block    There were no vitals filed for this visit.  Subjective Assessment - 07/06/18 1519    Subjective  I am still tight. Val did the best at un tightening it. It was loosened up for two days. I got a compression bra    Pertinent History   R DCIS with mastectomy SLNB on 04/20/18,no chemo or radiation  bilateral TKA (2 and 5 years ago), bunion surgery both toes, fibromyalgia, arthritis    Patient Stated Goals  to get back to where I was, stronger    Currently in Pain?  No/denies    Pain Score  0-No pain                       [facility] Adult PT Treatment/Exercise - 07/06/18 0001      Manual Therapy   Manual Lymphatic Drainage (MLD)  Issued handout and instructed pt in the following and had pt return demonstrate: In Supine: Short neck, superficial and deep abdominals, Lt axillary nodes and Rt inguinal nodes, then anterior inter-axillary and Rt axillo-inguinal anastomosis and focused on rt chest wall, especially inferior to incision then retracing all steps    Passive ROM  In Supine to Rt shoulder into flexion, abduction, D2 all to pts end ROM educated pt to retract shoulders throughout to decrease impingement                  PT Long Term Goals - 06/22/18 1720      PT LONG TERM GOAL #1  Title  Pt will demonstrate 165 degrees of R shoulder flexion to allow her to return to PLOF    Baseline  156    Time  4    Period  Weeks    Status  New    Target Date  07/20/18      PT LONG TERM GOAL #2   Title  Pt will be independent in a home exercise program for continued strengthening and stretching    Time  4    Period  Weeks    Status  New    Target Date  07/20/18      PT LONG TERM GOAL #3   Title  Pt will report a 50% improvement in swelling in right lateral trunk and inferior to mastectomy scar to allow improved comfort    Time  4    Period  Weeks    Status  New    Target Date  07/20/18      PT LONG TERM GOAL #4   Title  Pt will report a 75% improvement in tightness across chest with UE movements to allow pt to return to PLOF    Time  4    Period  Weeks    Status  New    Target Date  07/20/18            Plan - 07/06/18 1708    Clinical Impression Statement  Issued MLD handout today to pt  so she can follow along and perform self MLD at home. Worked on MLD today which seems to be decreasing some. Focused on increased right shoulder ROM. Pt was having some impingement during PROM. Educated pt about importance of posture for functional mobility of shoulder.     Rehab Potential  Excellent    PT Frequency  2x / week    PT Duration  4 weeks    PT Treatment/Interventions  ADLs/Self Care Home Management;Therapeutic activities;Therapeutic exercise;Patient/family education;Manual techniques;Manual lymph drainage;Compression bandaging;Scar mobilization;Passive range of motion;Taping    PT Next Visit Plan  give supine scap exercises, Review and Cont and instruct pt in MLD to right lateral trunk and area inferior to mastectomy scar, cont Rt shoulder ROM at end range flexion, scar mobilization, pec stretches, add supine scapular series (pt can probably do in standing against wall) and then later strength ABC program    PT Home Exercise Plan  self MLD    Consulted and Agree with Plan of Care  Patient       Patient will benefit from skilled therapeutic intervention in order to improve the following deficits and impairments:  Increased fascial restricitons, Pain, Decreased scar mobility, Decreased range of motion, Decreased strength, Decreased knowledge of precautions, Increased edema  Visit Diagnosis: Stiffness of right shoulder, not elsewhere classified  Localized edema  Aftercare following surgery for neoplasm     Problem List Patient Active Problem List   Diagnosis Date Noted  . Ductal carcinoma in situ (DCIS) of right breast 03/09/2018  . 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 Fibromyalgia 11/24/2011  . Lumbar degenerative disc disease 11/24/2011    Allyson Sabal [facility] 07/06/2018, 5:12 PM  [place], [place], [address] Phone: [phone]   Fax:  [phone]  Name: Jodi Gutierrez MRN: [id number] Date of  Birth: [birth year]  Manus Gunning, PT 07/06/18 5:12 PM

## 2018-07-09 ENCOUNTER — Ambulatory Visit: Payer: 59 | Admitting: Physical Therapy

## 2018-07-09 ENCOUNTER — Other Ambulatory Visit: Payer: Self-pay

## 2018-07-09 ENCOUNTER — Encounter: Payer: Self-pay | Admitting: Physical Therapy

## 2018-07-09 DIAGNOSIS — M25611 Stiffness of right shoulder, not elsewhere classified: Secondary | ICD-10-CM

## 2018-07-09 DIAGNOSIS — Z483 Aftercare following surgery for neoplasm: Secondary | ICD-10-CM

## 2018-07-09 DIAGNOSIS — M6281 Muscle weakness (generalized): Secondary | ICD-10-CM

## 2018-07-09 NOTE — Patient Instructions (Signed)

## 2018-07-09 NOTE — Therapy (Signed)
[place], [place], [address] Phone: [phone]   Fax:  [phone]  Physical Therapy Treatment  Patient Details  Name: Jodi Gutierrez MRN: [id number] Date of Birth: 01-24-1951 Referring Provider (PT): Hoxworth   Encounter Date: 07/09/2018  PT End of Session - 07/09/18 1231    Visit Number  5    Number of Visits  9    Date for PT Re-Evaluation  07/20/18    PT Start Time  0806    PT Stop Time  0847    PT Time Calculation (min)  41 min    Activity Tolerance  Patient tolerated treatment well    Behavior During Therapy  [facility] for tasks assessed/performed       Past Medical History:  Diagnosis Date  . Anemia    hx of as child and early adult   . Arthritis   . Breast cancer in female [facility])    Right  . Claustrophobia   . Fibromyalgia   . H/O fibromyalgia     Past Surgical History:  Procedure Laterality Date  . BUNIONECTOMY     bil  . COLONOSCOPY    . FINGER SURGERY     right ring finger /1994  . FINGER SURGERY    . KNEE ARTHROSCOPY Right   . KNEE SURGERY     left / arthroscopic  . MASTECTOMY W/ SENTINEL NODE BIOPSY Right 04/20/2018   Procedure: RIGHT TOTAL MASTECTOMY WITH AXILLARY SENTINEL LYMPH NODE BIOPSY;  Surgeon: Excell Seltzer, MD;  Location: [place];  Service: General;  Laterality: Right;  . TOTAL KNEE ARTHROPLASTY Right 02/15/2014   Procedure: RIGHT TOTAL KNEE ARTHROPLASTY;  Surgeon: Mauri Pole, MD;  [facility];  Service: Orthopedics;  Laterality: Right;  . TOTAL KNEE ARTHROPLASTY Left 03/25/2016   Procedure: LEFT TOTAL KNEE ARTHROPLASTY;  Surgeon: Paralee Cancel, MD;  [facility];  Service: Orthopedics;  Laterality: Left;  Adductor Block    There were no vitals filed for this visit.  Subjective Assessment - 07/09/18 0808    Subjective  I had a very bad fibromyalgia week. I am running on empty so I have not been working on that.     Pertinent History  R DCIS with  mastectomy SLNB on 04/20/18,no chemo or radiation  bilateral TKA (2 and 5 years ago), bunion surgery both toes, fibromyalgia, arthritis    Patient Stated Goals  to get back to where I was, stronger    Currently in Pain?  No/denies    Pain Score  0-No pain                       [facility] Adult PT Treatment/Exercise - 07/09/18 0001      Shoulder Exercises: Supine   Horizontal ABduction  Strengthening;Both;10 reps   pt returned therapist demo   Theraband Level (Shoulder Horizontal ABduction)  Level 2 (Red)    External Rotation  Strengthening;Both;10 reps   pt returned therapist demo   Theraband Level (Shoulder External Rotation)  Level 2 (Red)    Flexion  Strengthening;Both;10 reps   narrow and wide grip, pt returned therapist demo   Theraband Level (Shoulder Flexion)  Level 2 (Red)    Diagonals  Strengthening;Both;10 reps   pt returned therapist demo   Theraband Level (Shoulder Diagonals)  Level 2 (Red)      Manual Therapy   Manual Therapy  Soft tissue mobilization    Soft tissue mobilization  to  right lateral trunk in area of serratus anterior and lateral border of scapula where pt has increased tightness since pt reports being unable to sleep at night due to pain when lying on left side                  PT Long Term Goals - 06/22/18 1720      PT LONG TERM GOAL #1   Title  Pt will demonstrate 165 degrees of R shoulder flexion to allow her to return to PLOF    Baseline  156    Time  4    Period  Weeks    Status  New    Target Date  07/20/18      PT LONG TERM GOAL #2   Title  Pt will be independent in a home exercise program for continued strengthening and stretching    Time  4    Period  Weeks    Status  New    Target Date  07/20/18      PT LONG TERM GOAL #3   Title  Pt will report a 50% improvement in swelling in right lateral trunk and inferior to mastectomy scar to allow improved comfort    Time  4    Period  Weeks    Status  New    Target  Date  07/20/18      PT LONG TERM GOAL #4   Title  Pt will report a 75% improvement in tightness across chest with UE movements to allow pt to return to PLOF    Time  4    Period  Weeks    Status  New    Target Date  07/20/18            Plan - 07/09/18 1232    Clinical Impression Statement  Worked today on tightness on right lateral side since pt is unable to sleep on this side due to pain. She demonstrated improvement in muscle tightness in this area following masssage. Instructed pt in supine scapular exercises today and issued handout.     Rehab Potential  Excellent    PT Frequency  2x / week    PT Duration  4 weeks    PT Treatment/Interventions  ADLs/Self Care Home Management;Therapeutic activities;Therapeutic exercise;Patient/family education;Manual techniques;Manual lymph drainage;Compression bandaging;Scar mobilization;Passive range of motion;Taping    PT Next Visit Plan  assess indep with supine scap exercises, Review and Cont and instruct pt in MLD to right lateral trunk and area inferior to mastectomy scar, cont Rt shoulder ROM at end range flexion, scar mobilization, pec stretches, add supine scapular series (pt can probably do in standing against wall) and then later strength ABC program    PT Home Exercise Plan  self MLD, supine scap    Consulted and Agree with Plan of Care  Patient       Patient will benefit from skilled therapeutic intervention in order to improve the following deficits and impairments:  Increased fascial restricitons, Pain, Decreased scar mobility, Decreased range of motion, Decreased strength, Decreased knowledge of precautions, Increased edema  Visit Diagnosis: Stiffness of right shoulder, not elsewhere classified  Aftercare following surgery for neoplasm  Muscle weakness (generalized)     Problem List Patient Active Problem List   Diagnosis Date Noted  . Ductal carcinoma in situ (DCIS) of right breast 03/09/2018  . S/P right TKA  02/16/2014  . Fibromyalgia 11/24/2011  . Lumbar degenerative disc disease 11/24/2011    Allyson Sabal [facility]  07/09/2018, 12:34 PM  [place], [place], [address] Phone: [phone]   Fax:  [phone]  Name: Jodi Gutierrez MRN: [id number] Date of Birth: 06/11/1950  Manus Gunning, PT 07/09/18 12:34 PM

## 2018-07-15 ENCOUNTER — Ambulatory Visit: Payer: 59

## 2018-07-15 DIAGNOSIS — Z483 Aftercare following surgery for neoplasm: Secondary | ICD-10-CM

## 2018-07-15 DIAGNOSIS — M25611 Stiffness of right shoulder, not elsewhere classified: Secondary | ICD-10-CM | POA: Diagnosis not present

## 2018-07-15 DIAGNOSIS — R600 Localized edema: Secondary | ICD-10-CM

## 2018-07-15 DIAGNOSIS — M6281 Muscle weakness (generalized): Secondary | ICD-10-CM

## 2018-07-15 NOTE — Therapy (Signed)
[place], [place], [address] Phone: [phone]   Fax:  [phone]  Physical Therapy Treatment  Patient Details  Name: Jodi Gutierrez MRN: [id number] Date of Birth: 1950/11/18 Referring Provider (PT): Hoxworth   Encounter Date: 07/15/2018  PT End of Session - 07/15/18 1718    Visit Number  6    Number of Visits  9    Date for PT Re-Evaluation  07/20/18    PT Start Time  1301    PT Stop Time  1351    PT Time Calculation (min)  50 min    Activity Tolerance  Patient tolerated treatment well    Behavior During Therapy  [facility] for tasks assessed/performed       Past Medical History:  Diagnosis Date  . Anemia    hx of as child and early adult   . Arthritis   . Breast cancer in female [facility])    Right  . Claustrophobia   . Fibromyalgia   . H/O fibromyalgia     Past Surgical History:  Procedure Laterality Date  . BUNIONECTOMY     bil  . COLONOSCOPY    . FINGER SURGERY     right ring finger /1994  . FINGER SURGERY    . KNEE ARTHROSCOPY Right   . KNEE SURGERY     left / arthroscopic  . MASTECTOMY W/ SENTINEL NODE BIOPSY Right 04/20/2018   Procedure: RIGHT TOTAL MASTECTOMY WITH AXILLARY SENTINEL LYMPH NODE BIOPSY;  Surgeon: Excell Seltzer, MD;  Location: [place];  Service: General;  Laterality: Right;  . TOTAL KNEE ARTHROPLASTY Right 02/15/2014   Procedure: RIGHT TOTAL KNEE ARTHROPLASTY;  Surgeon: Mauri Pole, MD;  [facility];  Service: Orthopedics;  Laterality: Right;  . TOTAL KNEE ARTHROPLASTY Left 03/25/2016   Procedure: LEFT TOTAL KNEE ARTHROPLASTY;  Surgeon: Paralee Cancel, MD;  [facility];  Service: Orthopedics;  Laterality: Left;  Adductor Block    There were no vitals filed for this visit.  Subjective Assessment - 07/15/18 1309    Subjective  I tried laying on my Rt side and I still can't. It just feels like I have something in my side and it aches for awhile after I roll  back over.     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  No/denies                       [facility] Adult PT Treatment/Exercise - 07/15/18 0001      Manual Therapy   Soft tissue mobilization  to right lateral trunk in area of serratus anterior and lateral border of scapula where pt has increased tightness since pt reports being unable to sleep at night due to pain when lying on left side, good softening of tissue noted by end of session    Myofascial Release  Lt S/L over towel roll for pectoralis stretch and to Rt chest wall    Passive ROM  In supine to Rt shoulder into flexion, abduction and D2; then when in Lt S/L Rt abduction inconjunction with lateral trunk myofascial release                  PT Long Term Goals -  06/22/18 1720      PT LONG TERM GOAL #1   Title  Pt will demonstrate 165 degrees of R shoulder flexion to allow her to return to PLOF    Baseline  156    Time  4    Period  Weeks    Status  New    Target Date  07/20/18      PT LONG TERM GOAL #2   Title  Pt will be independent in a home exercise program for continued strengthening and stretching    Time  4    Period  Weeks    Status  New    Target Date  07/20/18      PT LONG TERM GOAL #3   Title  Pt will report a 50% improvement in swelling in right lateral trunk and inferior to mastectomy scar to allow improved comfort    Time  4    Period  Weeks    Status  New    Target Date  07/20/18      PT LONG TERM GOAL #4   Title  Pt will report a 75% improvement in tightness across chest with UE movements to allow pt to return to PLOF    Time  4    Period  Weeks    Status  New    Target Date  07/20/18            Plan - 07/15/18 1723    Clinical Impression Statement  Continued  focusing on decreasing tightness of Rt upper quadrant, but especially with pt in Lt S/L to focus on decreasing Rt lateral trunk myofascial tightness where pt c/o not being bale to lay on that side. By end of session she reported much softening noted here, also therapist was able to palpate this by end of session. Issued gray foam for trial at htis area to ses if this would further decrease fibrotic tissue at latearl trunk for pt to wear in her new compresison bra. Large dotted peach medi foam to try wearing for short periods of time.     Rehab Potential  Excellent    PT Frequency  2x / week    PT Duration  4 weeks    PT Treatment/Interventions  ADLs/Self Care Home Management;Therapeutic activities;Therapeutic exercise;Patient/family education;Manual techniques;Manual lymph drainage;Compression bandaging;Scar mobilization;Passive range of motion;Taping    PT Next Visit Plan  Assess foam and is pt able to lay on Rt side yet? Review and Cont and instruct pt in MLD to right lateral trunk and area inferior to mastectomy scar, cont Rt shoulder ROM at end range flexion, scar mobilization, pec stretches, and then later strength ABC program    Consulted and Agree with Plan of Care  Patient       Patient will benefit from skilled therapeutic intervention in order to improve the following deficits and impairments:  Increased fascial restricitons, Pain, Decreased scar mobility, Decreased range of motion, Decreased strength, Decreased knowledge of precautions, Increased edema  Visit Diagnosis: Stiffness of right shoulder, not elsewhere classified  Aftercare following surgery for neoplasm  Muscle weakness (generalized)  Localized edema     Problem List Patient Active Problem List   Diagnosis Date Noted  . Ductal carcinoma in situ (DCIS) of right breast 03/09/2018  . 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 Fibromyalgia 11/24/2011  . Lumbar degenerative disc disease 11/24/2011    Otelia Limes,  PTA 07/15/2018, 5:27 PM  [place]  [place], [place], [address] Phone: [phone]   Fax:  [phone]  Name: Jodi Gutierrez MRN: [id number] Date of Birth: Jan 23, 1951

## 2018-07-16 ENCOUNTER — Ambulatory Visit: Payer: 59 | Admitting: Physical Therapy

## 2018-07-20 ENCOUNTER — Ambulatory Visit: Payer: 59 | Admitting: Physical Therapy

## 2018-07-20 ENCOUNTER — Encounter: Payer: Self-pay | Admitting: Physical Therapy

## 2018-07-20 ENCOUNTER — Other Ambulatory Visit: Payer: Self-pay

## 2018-07-20 DIAGNOSIS — Z483 Aftercare following surgery for neoplasm: Secondary | ICD-10-CM

## 2018-07-20 DIAGNOSIS — M6281 Muscle weakness (generalized): Secondary | ICD-10-CM

## 2018-07-20 DIAGNOSIS — M25611 Stiffness of right shoulder, not elsewhere classified: Secondary | ICD-10-CM

## 2018-07-20 DIAGNOSIS — R600 Localized edema: Secondary | ICD-10-CM

## 2018-07-20 NOTE — Therapy (Signed)
[place], [place], [address] Phone: [phone]   Fax:  [phone]  Physical Therapy Treatment  Patient Details  Name: Jodi Gutierrez MRN: [id number] Date of Birth: Jul 13, 1950 Referring Provider (PT): Hoxworth   Encounter Date: 07/20/2018  PT End of Session - 07/20/18 1154    Visit Number  7    Number of Visits  17    Date for PT Re-Evaluation  08/17/18    PT Start Time  1109   pt had to use restroom prior to therapy   PT Stop Time  1146    PT Time Calculation (min)  37 min    Activity Tolerance  Patient tolerated treatment well    Behavior During Therapy  [facility] for tasks assessed/performed       Past Medical History:  Diagnosis Date  . Anemia    hx of as child and early adult   . Arthritis   . Breast cancer in female [facility])    Right  . Claustrophobia   . Fibromyalgia   . H/O fibromyalgia     Past Surgical History:  Procedure Laterality Date  . BUNIONECTOMY     bil  . COLONOSCOPY    . FINGER SURGERY     right ring finger /1994  . FINGER SURGERY    . KNEE ARTHROSCOPY Right   . KNEE SURGERY     left / arthroscopic  . MASTECTOMY W/ SENTINEL NODE BIOPSY Right 04/20/2018   Procedure: RIGHT TOTAL MASTECTOMY WITH AXILLARY SENTINEL LYMPH NODE BIOPSY;  Surgeon: Excell Seltzer, MD;  Location: [place];  Service: General;  Laterality: Right;  . TOTAL KNEE ARTHROPLASTY Right 02/15/2014   Procedure: RIGHT TOTAL KNEE ARTHROPLASTY;  Surgeon: Mauri Pole, MD;  [facility];  Service: Orthopedics;  Laterality: Right;  . TOTAL KNEE ARTHROPLASTY Left 03/25/2016   Procedure: LEFT TOTAL KNEE ARTHROPLASTY;  Surgeon: Paralee Cancel, MD;  [facility];  Service: Orthopedics;  Laterality: Left;  Adductor Block    There were no vitals filed for this visit.  Subjective Assessment - 07/20/18 1109    Subjective  I still don't have full range of motion. I am still having swelling. I can not sleep on  that sleep.     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  Yes    Pain Score  2     Pain Location  Axilla    Pain Orientation  Right    Pain Descriptors / Indicators  Dull;Aching                       [facility] Adult PT Treatment/Exercise - 07/20/18 0001      Shoulder Exercises: Supine   Other Supine Exercises  instructed pt in supine rotation while stretching right pec to help decrease tightness in right trunk      Manual Therapy   Soft tissue mobilization  to right lateral trunk in area of serratus anterior and lateral border of scapula where pt has increased tightness since pt reports being unable to sleep at night due to pain when lying on left side, good softening of tissue noted by end of session    Myofascial Release  to right lateral trunk  Passive ROM  In supine to Rt shoulder into flexion, abduction and D2; then when in Lt S/L Rt abduction inconjunction with lateral trunk myofascial release                  PT Long Term Goals - 07/20/18 1111      PT LONG TERM GOAL #1   Title  Pt will demonstrate 165 degrees of R shoulder flexion to allow her to return to PLOF    Baseline  156, 07/20/18- 153    Time  4    Period  Weeks    Status  On-going      PT LONG TERM GOAL #2   Title  Pt will be independent in a home exercise program for continued strengthening and stretching    Time  4    Period  Weeks    Status  On-going      PT LONG TERM GOAL #3   Title  Pt will report a 50% improvement in swelling in right lateral trunk and inferior to mastectomy scar to allow improved comfort    Baseline  07/20/18- pt reports this is about the same    Time  4    Period  Weeks    Status  On-going      PT LONG TERM GOAL #4   Title  Pt will report a 75%  improvement in tightness across chest with UE movements to allow pt to return to PLOF    Baseline  07/20/18- pt reports right after therapy it is better but then tightens back up    Time  4    Period  Weeks    Status  On-going            Plan - 07/20/18 1155    Clinical Impression Statement  Pt is still having a lot of tightness in right trunk and has trouble sleeping on that side due to the discomfort. Pt also still demonstrates decreased right shoulder ROM. Instructed pt today in supine trunk stretch. Educated pt to hold all stretches for 60 seconds. Pt has been wearing grey foam in her bra to help with swelling and discomfort of lateral trunk. Pt would benefit from additional skilled PT services to improve right shoulder ROM, decrease right trunk tightness, improve posture and progress pt towards independence with a home exercise program.     Rehab Potential  Excellent    PT Frequency  2x / week    PT Duration  4 weeks    PT Treatment/Interventions  ADLs/Self Care Home Management;Therapeutic activities;Therapeutic exercise;Patient/family education;Manual techniques;Manual lymph drainage;Compression bandaging;Scar mobilization;Passive range of motion;Taping    PT Next Visit Plan  give additional posture exercises,  Review and Cont and instruct pt in MLD to right lateral trunk and area inferior to mastectomy scar, cont Rt shoulder ROM at end range flexion, scar mobilization, pec stretches, and then later strength ABC program    PT Home Exercise Plan  self MLD, supine scap    Consulted and Agree with Plan of Care  Patient       Patient will benefit from skilled therapeutic intervention in order to improve the following deficits and impairments:  Increased fascial restricitons, Pain, Decreased scar mobility, Decreased range of motion, Decreased strength, Decreased knowledge of precautions, Increased edema  Visit Diagnosis: Stiffness of right shoulder, not elsewhere classified  Aftercare  following surgery for neoplasm  Muscle weakness (generalized)  Localized edema     Problem List Patient Active Problem List  Diagnosis Date Noted  . Ductal carcinoma in situ (DCIS) of right breast 03/09/2018  . 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 Fibromyalgia 11/24/2011  . Lumbar degenerative disc disease 11/24/2011    Allyson Sabal [facility] 07/20/2018, 12:01 PM  [place], [place], [address] Phone: [phone]   Fax:  [phone]  Name: Jodi Gutierrez MRN: [id number] Date of Birth: 09-23-1950  Manus Gunning, PT 07/20/18 12:01 PM

## 2018-07-23 ENCOUNTER — Other Ambulatory Visit: Payer: Self-pay

## 2018-07-23 ENCOUNTER — Ambulatory Visit: Payer: 59 | Admitting: Physical Therapy

## 2018-07-23 ENCOUNTER — Encounter: Payer: Self-pay | Admitting: Physical Therapy

## 2018-07-23 DIAGNOSIS — M25611 Stiffness of right shoulder, not elsewhere classified: Secondary | ICD-10-CM

## 2018-07-23 DIAGNOSIS — Z483 Aftercare following surgery for neoplasm: Secondary | ICD-10-CM

## 2018-07-23 NOTE — Therapy (Signed)
[place], [place], [address] Phone: [phone]   Fax:  [phone]  Physical Therapy Treatment  Patient Details  Name: Jodi Gutierrez MRN: [id number] Date of Birth: [birth year] Referring Provider (PT): Hoxworth   Encounter Date: 07/23/2018  PT End of Session - 07/23/18 1219    Visit Number  8    Number of Visits  17    Date for PT Re-Evaluation  08/17/18    PT Start Time  0804    PT Stop Time  0845    PT Time Calculation (min)  41 min    Activity Tolerance  Patient tolerated treatment well    Behavior During Therapy  [facility] for tasks assessed/performed       Past Medical History:  Diagnosis Date  . Anemia    hx of as child and early adult   . Arthritis   . Breast cancer in female [facility])    Right  . Claustrophobia   . Fibromyalgia   . H/O fibromyalgia     Past Surgical History:  Procedure Laterality Date  . BUNIONECTOMY     bil  . COLONOSCOPY    . FINGER SURGERY     right ring finger /1994  . FINGER SURGERY    . KNEE ARTHROSCOPY Right   . KNEE SURGERY     left / arthroscopic  . MASTECTOMY W/ SENTINEL NODE BIOPSY Right 04/20/2018   Procedure: RIGHT TOTAL MASTECTOMY WITH AXILLARY SENTINEL LYMPH NODE BIOPSY;  Surgeon: Excell Seltzer, MD;  Location: [place];  Service: General;  Laterality: Right;  . TOTAL KNEE ARTHROPLASTY Right 02/15/2014   Procedure: RIGHT TOTAL KNEE ARTHROPLASTY;  Surgeon: Mauri Pole, MD;  [facility];  Service: Orthopedics;  Laterality: Right;  . TOTAL KNEE ARTHROPLASTY Left 03/25/2016   Procedure: LEFT TOTAL KNEE ARTHROPLASTY;  Surgeon: Paralee Cancel, MD;  [facility];  Service: Orthopedics;  Laterality: Left;  Adductor Block    There were no vitals filed for this visit.  Subjective Assessment - 07/23/18 0806    Subjective  You have helped the pain. I am able to sleep on my side a little bit.     Pertinent History  R DCIS with mastectomy SLNB on  04/20/18,no chemo or radiation  bilateral TKA (2 and 5 years ago), bunion surgery both toes, fibromyalgia, arthritis    Patient Stated Goals  to get back to where I was, stronger    Currently in Pain?  No/denies    Pain Score  0-No pain                       [facility] Adult PT Treatment/Exercise - 07/23/18 0001      Manual Therapy   Soft tissue mobilization  to right lateral trunk in area of serratus anterior and lateral border of scapula where pt has increased tightness since pt reports being unable to sleep at night due to pain when lying on left side, good softening of tissue noted by end of session    Myofascial Release  to right lateral trunk     Passive ROM  In supine to Rt shoulder into flexion, abduction and D2; then when in Lt S/L Rt abduction inconjunction with lateral trunk myofascial release                  PT Long Term Goals - 07/20/18 1111      PT LONG TERM GOAL #1  Title  Pt will demonstrate 165 degrees of R shoulder flexion to allow her to return to PLOF    Baseline  156, 07/20/18- 153    Time  4    Period  Weeks    Status  On-going      PT LONG TERM GOAL #2   Title  Pt will be independent in a home exercise program for continued strengthening and stretching    Time  4    Period  Weeks    Status  On-going      PT LONG TERM GOAL #3   Title  Pt will report a 50% improvement in swelling in right lateral trunk and inferior to mastectomy scar to allow improved comfort    Baseline  07/20/18- pt reports this is about the same    Time  4    Period  Weeks    Status  On-going      PT LONG TERM GOAL #4   Title  Pt will report a 75% improvement in tightness across chest with UE movements to allow pt to return to PLOF    Baseline  07/20/18- pt reports right after therapy it is better but then tightens back up    Time  4    Period  Weeks    Status  On-going            Plan - 07/23/18 1219    Clinical Impression Statement  Continued with  manual therapy to decrease tightness in right lateral trunk. Pt reports she feels better after therapy. She has decreased tightness in her serratus today compared to last visit.     Rehab Potential  Excellent    PT Frequency  2x / week    PT Duration  4 weeks    PT Treatment/Interventions  ADLs/Self Care Home Management;Therapeutic activities;Therapeutic exercise;Patient/family education;Manual techniques;Manual lymph drainage;Compression bandaging;Scar mobilization;Passive range of motion;Taping    PT Next Visit Plan  give additional posture exercises,  Review and Cont and instruct pt in MLD to right lateral trunk and area inferior to mastectomy scar, cont Rt shoulder ROM at end range flexion, scar mobilization, pec stretches, and then later strength ABC program    PT Home Exercise Plan  self MLD, supine scap    Consulted and Agree with Plan of Care  Patient       Patient will benefit from skilled therapeutic intervention in order to improve the following deficits and impairments:  Increased fascial restricitons, Pain, Decreased scar mobility, Decreased range of motion, Decreased strength, Decreased knowledge of precautions, Increased edema  Visit Diagnosis: Stiffness of right shoulder, not elsewhere classified  Aftercare following surgery for neoplasm     Problem List Patient Active Problem List   Diagnosis Date Noted  . Ductal carcinoma in situ (DCIS) of right breast 03/09/2018  . 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 Fibromyalgia 11/24/2011  . Lumbar degenerative disc disease 11/24/2011    Allyson Sabal [facility] 07/23/2018, 12:24 PM  [place], [place], [address] Phone: [phone]   Fax:  [phone]  Name: Jodi Gutierrez MRN: [id number] Date of Birth: Dec 08, 1950  Manus Gunning, PT 07/23/18 12:24 PM

## 2018-07-28 ENCOUNTER — Ambulatory Visit: Payer: 59 | Attending: General Surgery | Admitting: Physical Therapy

## 2018-07-28 ENCOUNTER — Encounter: Payer: Self-pay | Admitting: Physical Therapy

## 2018-07-28 ENCOUNTER — Other Ambulatory Visit: Payer: Self-pay

## 2018-07-28 DIAGNOSIS — M25611 Stiffness of right shoulder, not elsewhere classified: Secondary | ICD-10-CM | POA: Diagnosis present

## 2018-07-28 DIAGNOSIS — Z483 Aftercare following surgery for neoplasm: Secondary | ICD-10-CM | POA: Diagnosis present

## 2018-07-28 DIAGNOSIS — M6281 Muscle weakness (generalized): Secondary | ICD-10-CM | POA: Insufficient documentation

## 2018-07-28 NOTE — Therapy (Signed)
[place], [place], [address] Phone: [phone]   Fax:  [phone]  Physical Therapy Treatment  Patient Details  Name: Jodi Gutierrez MRN: [id number] Date of Birth: 1950-09-14 Referring Provider (PT): Hoxworth   Encounter Date: 07/28/2018  PT End of Session - 07/28/18 1104    Visit Number  9    Number of Visits  17    Date for PT Re-Evaluation  08/17/18    PT Start Time  0935    PT Stop Time  1018    PT Time Calculation (min)  43 min    Activity Tolerance  Patient tolerated treatment well    Behavior During Therapy  [facility] for tasks assessed/performed       Past Medical History:  Diagnosis Date  . Anemia    hx of as child and early adult   . Arthritis   . Breast cancer in female [facility])    Right  . Claustrophobia   . Fibromyalgia   . H/O fibromyalgia     Past Surgical History:  Procedure Laterality Date  . BUNIONECTOMY     bil  . COLONOSCOPY    . FINGER SURGERY     right ring finger /1994  . FINGER SURGERY    . KNEE ARTHROSCOPY Right   . KNEE SURGERY     left / arthroscopic  . MASTECTOMY W/ SENTINEL NODE BIOPSY Right 04/20/2018   Procedure: RIGHT TOTAL MASTECTOMY WITH AXILLARY SENTINEL LYMPH NODE BIOPSY;  Surgeon: Excell Seltzer, MD;  Location: [place];  Service: General;  Laterality: Right;  . TOTAL KNEE ARTHROPLASTY Right 02/15/2014   Procedure: RIGHT TOTAL KNEE ARTHROPLASTY;  Surgeon: Mauri Pole, MD;  Location: [facility];  Service: Orthopedics;  Laterality: Right;  . TOTAL KNEE ARTHROPLASTY Left 03/25/2016   Procedure: LEFT TOTAL KNEE ARTHROPLASTY;  Surgeon: Paralee Cancel, MD;  Location: [facility];  Service: Orthopedics;  Laterality: Left;  Adductor Block    There were no vitals filed for this visit.  Subjective Assessment - 07/28/18 0937    Subjective  My pain is ok. I still can not sleep on the right side. It is better than it was when we started. It is still very tight and very  hard.     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  No/denies    Pain Score  0-No pain                       [facility] Adult PT Treatment/Exercise - 07/28/18 0001      Manual Therapy   Soft tissue mobilization  to right lateral trunk in area of serratus anterior and lateral border of scapula where pt has increased tightness since pt reports being unable to sleep at night due to pain when lying on left side, then worked just anterior to this area at lateral chest in area of tightness    Passive ROM  Lying over foam roll: In supine to Rt shoulder into flexion, abduction and D2; then when in Lt S/L Rt abduction inconjunction with lateral trunk myofascial release                  PT Long Term Goals - 07/20/18 1111  PT LONG TERM GOAL #1   Title  Pt will demonstrate 165 degrees of R shoulder flexion to allow her to return to PLOF    Baseline  156, 07/20/18- 153    Time  4    Period  Weeks    Status  On-going      PT LONG TERM GOAL #2   Title  Pt will be independent in a home exercise program for continued strengthening and stretching    Time  4    Period  Weeks    Status  On-going      PT LONG TERM GOAL #3   Title  Pt will report a 50% improvement in swelling in right lateral trunk and inferior to mastectomy scar to allow improved comfort    Baseline  07/20/18- pt reports this is about the same    Time  4    Period  Weeks    Status  On-going      PT LONG TERM GOAL #4   Title  Pt will report a 75% improvement in tightness across chest with UE movements to allow pt to return to PLOF    Baseline  07/20/18- pt reports right after therapy it is better but then tightens back up    Time  4    Period  Weeks    Status  On-going            Plan -  07/28/18 1104    Clinical Impression Statement  Pt is still having difficulty sleeping on her right side. Continued with soft tissue mobilization to lateral trunk in area of tightness and today worked slightly anterior to last session because this area was more tight. Continued PROM today to R shoulder but had pt lie over foam roll to increase stretch.     Rehab Potential  Excellent    PT Frequency  2x / week    PT Duration  4 weeks    PT Treatment/Interventions  ADLs/Self Care Home Management;Therapeutic activities;Therapeutic exercise;Patient/family education;Manual techniques;Manual lymph drainage;Compression bandaging;Scar mobilization;Passive range of motion;Taping    PT Next Visit Plan  give additional posture exercises,  Review and Cont and instruct pt in MLD to right lateral trunk and area inferior to mastectomy scar, cont Rt shoulder ROM at end range flexion, scar mobilization, pec stretches, and then later strength ABC program    PT Home Exercise Plan  self MLD, supine scap    Consulted and Agree with Plan of Care  Patient       Patient will benefit from skilled therapeutic intervention in order to improve the following deficits and impairments:  Increased fascial restricitons, Pain, Decreased scar mobility, Decreased range of motion, Decreased strength, Decreased knowledge of precautions, Increased edema  Visit Diagnosis: Stiffness of right shoulder, not elsewhere classified  Aftercare following surgery for neoplasm     Problem List Patient Active Problem List   Diagnosis Date Noted  . Ductal carcinoma in situ (DCIS) of right breast 03/09/2018  . 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 Fibromyalgia 11/24/2011  . Lumbar degenerative disc disease 11/24/2011    Jodi Gutierrez [facility] 07/28/2018, 11:07 AM  [place], [place], [address] Phone: [phone]   Fax:  [phone]  Name: Jodi Gutierrez MRN:  [id number] Date of Birth: Jan 14, 1951  Manus Gunning, PT 07/28/18 11:07 AM

## 2018-07-30 ENCOUNTER — Encounter: Payer: Self-pay | Admitting: Physical Therapy

## 2018-07-30 ENCOUNTER — Other Ambulatory Visit: Payer: Self-pay

## 2018-07-30 ENCOUNTER — Ambulatory Visit: Payer: 59 | Admitting: Physical Therapy

## 2018-07-30 DIAGNOSIS — M25611 Stiffness of right shoulder, not elsewhere classified: Secondary | ICD-10-CM | POA: Diagnosis not present

## 2018-07-30 DIAGNOSIS — Z483 Aftercare following surgery for neoplasm: Secondary | ICD-10-CM

## 2018-07-30 NOTE — Therapy (Signed)
[place], [place], [address] Phone: [phone]   Fax:  [phone]  Physical Therapy Treatment  Patient Details  Name: Jodi Gutierrez MRN: [id number] Date of Birth: 1951-02-08 Referring Provider (PT): Hoxworth   Encounter Date: 07/30/2018  PT End of Session - 07/30/18 0849    Visit Number  10    Number of Visits  17    Date for PT Re-Evaluation  08/17/18    PT Start Time  0806    PT Stop Time  0849    PT Time Calculation (min)  43 min    Activity Tolerance  Patient tolerated treatment well    Behavior During Therapy  [facility] for tasks assessed/performed       Past Medical History:  Diagnosis Date  . Anemia    hx of as child and early adult   . Arthritis   . Breast cancer in female [facility])    Right  . Claustrophobia   . Fibromyalgia   . H/O fibromyalgia     Past Surgical History:  Procedure Laterality Date  . BUNIONECTOMY     bil  . COLONOSCOPY    . FINGER SURGERY     right ring finger /1994  . FINGER SURGERY    . KNEE ARTHROSCOPY Right   . KNEE SURGERY     left / arthroscopic  . MASTECTOMY W/ SENTINEL NODE BIOPSY Right 04/20/2018   Procedure: RIGHT TOTAL MASTECTOMY WITH AXILLARY SENTINEL LYMPH NODE BIOPSY;  Surgeon: Excell Seltzer, MD;  Location: [place];  Service: General;  Laterality: Right;  . TOTAL KNEE ARTHROPLASTY Right 02/15/2014   Procedure: RIGHT TOTAL KNEE ARTHROPLASTY;  Surgeon: Mauri Pole, MD;  Location[facility];  Service: Orthopedics;  Laterality: Right;  . TOTAL KNEE ARTHROPLASTY Left 03/25/2016   Procedure: LEFT TOTAL KNEE ARTHROPLASTY;  Surgeon: Paralee Cancel, MD;  Location[facility];  Service: Orthopedics;  Laterality: Left;  Adductor Block    There were no vitals filed for this visit.  Subjective Assessment - 07/30/18 0807    Subjective  It was better to sleep on my side Tuesday night but this morning it was very painful and the pain went around the front. The only  thing that chnaged was the pain wrapped around the front.     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  No/denies    Pain Score  0-No pain                       [facility] Adult PT Treatment/Exercise - 07/30/18 0001      Manual Therapy   Soft tissue mobilization  to right lateral trunk in area of serratus anterior and lateral border of scapula where pt has increased tightness since pt reports being unable to sleep at night due to pain when lying on left side, then worked just anterior to this area at lateral chest in area of tightness    Myofascial Release  pulling to RUE throughout PROM    Passive ROM  Lying over foam roll: In supine to Rt shoulder into flexion, abduction and D2; then when in Lt S/L Rt abduction inconjunction with lateral trunk myofascial release  PT Long Term Goals - 07/20/18 1111      PT LONG TERM GOAL #1   Title  Pt will demonstrate 165 degrees of R shoulder flexion to allow her to return to PLOF    Baseline  156, 07/20/18- 153    Time  4    Period  Weeks    Status  On-going      PT LONG TERM GOAL #2   Title  Pt will be independent in a home exercise program for continued strengthening and stretching    Time  4    Period  Weeks    Status  On-going      PT LONG TERM GOAL #3   Title  Pt will report a 50% improvement in swelling in right lateral trunk and inferior to mastectomy scar to allow improved comfort    Baseline  07/20/18- pt reports this is about the same    Time  4    Period  Weeks    Status  On-going      PT LONG TERM GOAL #4   Title  Pt will report a 75% improvement in tightness across chest with UE movements to allow pt to return to PLOF    Baseline  07/20/18- pt reports right after therapy it is better  but then tightens back up    Time  4    Period  Weeks    Status  On-going            Plan - 07/30/18 0849    Clinical Impression Statement  Continued soft tissue mobilization to lateral trunk especially along lateral area of mastectomy scar. This area softened tremendously. Continued PROM over foam roll to RUE with RUE pulling in to abduction to stretch pec. Pt had soreness after supine scap series so educated pt to only do 10 reps as instructed because pt did 20.     Rehab Potential  Excellent    PT Frequency  2x / week    PT Duration  4 weeks    PT Treatment/Interventions  ADLs/Self Care Home Management;Therapeutic activities;Therapeutic exercise;Patient/family education;Manual techniques;Manual lymph drainage;Compression bandaging;Scar mobilization;Passive range of motion;Taping    PT Next Visit Plan  give additional posture exercises, cont Rt shoulder ROM at end range flexion, scar mobilization, pec stretches, and then later strength ABC program    PT Home Exercise Plan  self MLD, supine scap    Consulted and Agree with Plan of Care  Patient       Patient will benefit from skilled therapeutic intervention in order to improve the following deficits and impairments:  Increased fascial restricitons, Pain, Decreased scar mobility, Decreased range of motion, Decreased strength, Decreased knowledge of precautions, Increased edema  Visit Diagnosis: Stiffness of right shoulder, not elsewhere classified  Aftercare following surgery for neoplasm     Problem List Patient Active Problem List   Diagnosis Date Noted  . Ductal carcinoma in situ (DCIS) of right breast 03/09/2018  . 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 Fibromyalgia 11/24/2011  . Lumbar degenerative disc disease 11/24/2011    [facility] 07/30/2018, 8:54 AM  [place], [place], [address] Phone: [phone]   Fax:  [phone]  Name:  Jodi Gutierrez MRN: [id number] Date of Birth: December 11, 1950  Manus Gunning, PT 07/30/18 8:54 AM

## 2018-07-30 NOTE — Progress Notes (Signed)
Patient Care Team: Deland Pretty, MD as PCP - General (Internal Medicine) Excell Seltzer, MD as Consulting Physician (General Surgery) Nicholas Lose, MD as Consulting Physician (Hematology and Oncology) Gery Pray, MD as Consulting Physician (Radiation Oncology)  DIAGNOSIS:    ICD-10-CM   1. Ductal carcinoma in situ (DCIS) of right breast D05.11 MM DIAG BREAST TOMO UNI LEFT    SUMMARY OF ONCOLOGIC HISTORY:   Ductal carcinoma in situ (DCIS) of right breast   03/09/2018 Initial Diagnosis    Ductal carcinoma in situ (DCIS) of right breast    04/20/2018 Surgery    Right mastectomy: DCIS 7 cm, margins negative, 0/2 lymph nodes negative, ER 100% strong, PR 95% strong Tis N0 stage 0    05/27/2018 -  Anti-estrogen oral therapy    Tamoxifen, 5mg  daily     CHIEF COMPLIANT: Follow-up of tamoxifen  INTERVAL HISTORY: Jodi Gutierrez is a 68 y.o. with above-mentioned history of right breast DCIS treated with right mastectomy who is currently on antiestrogen therapy with tamoxifen at 5mg  daily. She presents to the clinic alone today and is tolerating tamoxifen fairly well. She reports flatulence and loose stools, and has recently had diverticulitis. She reports difficulty sleeping. She denies hot flashes, change in taste or appetite, or muscle cramps. She is still seeing a physical therapist for pain in her right chest at the mastectomy site. She reviewed her medication list with me. She will transition to part time work in April.   REVIEW OF SYSTEMS:   Constitutional: Denies fevers, chills or abnormal weight loss Eyes: Denies blurriness of vision Ears, nose, mouth, throat, and face: Denies mucositis or sore throat Respiratory: Denies cough, dyspnea or wheezes Cardiovascular: Denies palpitation, chest discomfort Gastrointestinal: Denies nausea, heartburn (+) loose stools (+) flatulence  Skin: Denies abnormal skin rashes Lymphatics: Denies easy bruising (+) lymphedema in right  arm Neurological: Denies numbness, tingling or new weaknesses Behavioral/Psych: Mood is stable, no new changes (+) difficulty sleeping Extremities: No lower extremity edema Breast: denies any lumps or nodules in either breasts (+) pain in right chest All other systems were reviewed with the patient and are negative.  I have reviewed the past medical history, past surgical history, social history and family history with the patient and they are unchanged from previous note.  ALLERGIES:  is allergic to silver; tape; nickel; and penicillins.  MEDICATIONS:  Current Outpatient Medications  Medication Sig Dispense Refill  . acetaminophen (TYLENOL) 500 MG tablet Take 2 tablets (1,000 mg total) by mouth every 8 (eight) hours. 30 tablet 0  . amitriptyline (ELAVIL) 25 MG tablet Take 25 mg by mouth at bedtime as needed for sleep.     . calcium carbonate (OSCAL) 1500 (600 Ca) MG TABS tablet Take 600 mg by mouth 2 (two) times daily with a meal.    . Cholecalciferol (VITAMIN D3) 5000 units TBDP Take 10,000 Units by mouth daily.     . diazepam (VALIUM) 5 MG tablet Take 5 mg by mouth at bedtime as needed for sedation.     . ferrous sulfate 325 (65 FE) MG tablet Take 1 tablet (325 mg total) by mouth 3 (three) times daily after meals. (Patient not taking: Reported on 10/27/2017)  3  . methocarbamol (ROBAXIN) 500 MG tablet Take 1 tablet (500 mg total) by mouth every 6 (six) hours as needed for muscle spasms. (Patient not taking: Reported on 03/11/2018) 20 tablet 0  . Multiple Vitamin (MULTIVITAMIN) tablet Take 1 tablet by mouth every morning.     Marland Kitchen  psyllium (METAMUCIL) 58.6 % packet Take 1 packet by mouth daily.    . tamoxifen (NOLVADEX) 10 MG tablet Take 1 tablet (10 mg total) by mouth daily. Take 1/2 tablet daily (5mg ) 90 tablet 0  . traMADol (ULTRAM) 50 MG tablet Take 1 tablet (50 mg total) by mouth every 6 (six) hours as needed. 20 tablet 0  . VITAMIN A PO Take 2,400 mcg by mouth daily.      Current  Facility-Administered Medications  Medication Dose Route Frequency Provider Last Rate Last Dose  . 0.9 %  sodium chloride infusion  500 mL Intravenous Once Nandigam, Venia Minks, MD        PHYSICAL EXAMINATION: ECOG PERFORMANCE STATUS: 0 - Asymptomatic  Vitals:   08/04/18 0813  BP: (!) 147/66  Pulse: 91  Resp: 17  Temp: 98.2 F (36.8 C)  SpO2: 100%   Filed Weights   08/04/18 0813  Weight: 146 lb 6.4 oz (66.4 kg)    GENERAL: alert, no distress and comfortable SKIN: skin color, texture, turgor are normal, no rashes or significant lesions EYES: normal, Conjunctiva are pink and non-injected, sclera clear OROPHARYNX: no exudate, no erythema and lips, buccal mucosa, and tongue normal  NECK: supple, thyroid normal size, non-tender, without nodularity LYMPH: no palpable lymphadenopathy in the cervical, axillary or inguinal LUNGS: clear to auscultation and percussion with normal breathing effort HEART: regular rate & rhythm and no murmurs and no lower extremity edema ABDOMEN: abdomen soft, non-tender and normal bowel sounds MUSCULOSKELETAL: no cyanosis of digits and no clubbing  NEURO: alert & oriented x 3 with fluent speech, no focal motor/sensory deficits EXTREMITIES: No lower extremity edema  LABORATORY DATA:  I have reviewed the data as listed CMP Latest Ref Rng & Units 04/16/2018 03/11/2018 10/27/2017  Glucose 70 - 99 mg/dL 110(H) 90 -  BUN 8 - 23 mg/dL 11 13 9   Creatinine 0.44 - 1.00 mg/dL 0.95 0.82 -  Sodium 135 - 145 mmol/L 139 144 -  Potassium 3.5 - 5.1 mmol/L 3.7 3.9 -  Chloride 98 - 111 mmol/L 104 104 -  CO2 22 - 32 mmol/L 28 28 -  Calcium 8.9 - 10.3 mg/dL 9.2 9.2 -  Total Protein 6.5 - 8.1 g/dL 6.8 6.9 -  Total Bilirubin 0.3 - 1.2 mg/dL 0.5 0.2(L) -  Alkaline Phos 38 - 126 U/L 72 84 -  AST 15 - 41 U/L 22 17 -  ALT 0 - 44 U/L 17 12 -    Lab Results  Component Value Date   WBC 10.5 04/21/2018   HGB 11.7 (L) 04/21/2018   HCT 35.1 (L) 04/21/2018   MCV 93.6  04/21/2018   PLT 265 04/21/2018   NEUTROABS 3.4 03/11/2018    ASSESSMENT & PLAN:  Ductal carcinoma in situ (DCIS) of right breast 04/20/2018: Right mastectomy: DCIS 7 cm, margins negative, 0/2 lymph nodes negative, ER 100% strong, PR 95% strong Tis N0 stage 0 Recommendation: Adjuvant antiestrogen therapy with tamoxifen 20 mg daily x5 years (based on TAM-01 clinical trial data, patient is taking 5 mg dose)  Tamoxifen toxicities: Patient has noticed that her bowel movements are loose and she worries about diverticulitis.  She attributes this to tamoxifen therapy.  Breast cancer surveillance: Annual mammograms and breast exams Mammogram on the left breast will be scheduled towards end of September 2020.  [facility].  Return to clinic in 1 year for follow-up.    Orders Placed This Encounter  Procedures  . MM DIAG BREAST  TOMO UNI LEFT    Standing Status:   Future    Standing Expiration Date:   08/04/2019    Order Specific Question:   Reason for Exam (SYMPTOM  OR DIAGNOSIS REQUIRED)    Answer:   Annual mammogram with H/O Right mastectomy for DCIS    Order Specific Question:   Preferred imaging location?    Answer:   External    Comments:   [facility]   The patient has a good understanding of the overall plan. she agrees with it. she will call with any problems that may develop before the next visit here.  Nicholas Lose, MD 08/04/2018  Julious Oka Dorshimer am acting as scribe for Dr. Nicholas Lose.  I have reviewed the above documentation for accuracy and completeness, and I agree with the above.

## 2018-08-04 ENCOUNTER — Inpatient Hospital Stay: Payer: 59 | Attending: Hematology and Oncology | Admitting: Hematology and Oncology

## 2018-08-04 DIAGNOSIS — Z170 Estrogen receptor positive status [ER+]: Secondary | ICD-10-CM

## 2018-08-04 DIAGNOSIS — Z79899 Other long term (current) drug therapy: Secondary | ICD-10-CM | POA: Diagnosis not present

## 2018-08-04 DIAGNOSIS — D0511 Intraductal carcinoma in situ of right breast: Secondary | ICD-10-CM | POA: Insufficient documentation

## 2018-08-04 DIAGNOSIS — Z9011 Acquired absence of right breast and nipple: Secondary | ICD-10-CM | POA: Diagnosis not present

## 2018-08-04 NOTE — Assessment & Plan Note (Signed)
04/20/2018: Right mastectomy: DCIS 7 cm, margins negative, 0/2 lymph nodes negative, ER 100% strong, PR 95% strong Tis N0 stage 0 Recommendation: Adjuvant antiestrogen therapy with tamoxifen 20 mg daily x5 years (based on TAM-01 clinical trial data, patient is taking 5 mg dose)  Tamoxifen toxicities:  Breast cancer surveillance: Annual mammograms and breast exams Return to clinic in 6 months for follow-up

## 2018-08-06 ENCOUNTER — Other Ambulatory Visit: Payer: Self-pay

## 2018-08-06 ENCOUNTER — Ambulatory Visit: Payer: 59 | Admitting: Physical Therapy

## 2018-08-06 ENCOUNTER — Encounter: Payer: Self-pay | Admitting: Physical Therapy

## 2018-08-06 DIAGNOSIS — M25611 Stiffness of right shoulder, not elsewhere classified: Secondary | ICD-10-CM

## 2018-08-06 DIAGNOSIS — Z483 Aftercare following surgery for neoplasm: Secondary | ICD-10-CM

## 2018-08-06 NOTE — Therapy (Signed)
[place], [place], [address] Phone: [phone]   Fax:  [phone]  Physical Therapy Treatment  Patient Details  Name: Jodi Gutierrez MRN: [id number] Date of Birth: 1951/01/26 Referring Provider (PT): Hoxworth   Encounter Date: 08/06/2018  PT End of Session - 08/06/18 1207    Visit Number  11    Number of Visits  17    Date for PT Re-Evaluation  08/17/18    PT Start Time  0812   started late due to EPIC problems   PT Stop Time  0850    PT Time Calculation (min)  38 min    Behavior During Therapy  [facility] for tasks assessed/performed       Past Medical History:  Diagnosis Date  . Anemia    hx of as child and early adult   . Arthritis   . Breast cancer in female [facility])    Right  . Claustrophobia   . Fibromyalgia   . H/O fibromyalgia     Past Surgical History:  Procedure Laterality Date  . BUNIONECTOMY     bil  . COLONOSCOPY    . FINGER SURGERY     right ring finger /1994  . FINGER SURGERY    . KNEE ARTHROSCOPY Right   . KNEE SURGERY     left / arthroscopic  . MASTECTOMY W/ SENTINEL NODE BIOPSY Right 04/20/2018   Procedure: RIGHT TOTAL MASTECTOMY WITH AXILLARY SENTINEL LYMPH NODE BIOPSY;  Surgeon: Excell Seltzer, MD;  Location: [place];  Service: General;  Laterality: Right;  . TOTAL KNEE ARTHROPLASTY Right 02/15/2014   Procedure: RIGHT TOTAL KNEE ARTHROPLASTY;  Surgeon: Mauri Pole, MD;  [facility];  Service: Orthopedics;  Laterality: Right;  . TOTAL KNEE ARTHROPLASTY Left 03/25/2016   Procedure: LEFT TOTAL KNEE ARTHROPLASTY;  Surgeon: Paralee Cancel, MD;  Location: [facility];  Service: Orthopedics;  Laterality: Left;  Adductor Block    There were no vitals filed for this visit.  Subjective Assessment - 08/06/18 1206    Subjective  I know I am getting better but I still can not sleep on my side.     Pertinent History  R DCIS with mastectomy SLNB on 04/20/18,no chemo or radiation   bilateral TKA (2 and 5 years ago), bunion surgery both toes, fibromyalgia, arthritis    Patient Stated Goals  to get back to where I was, stronger    Currently in Pain?  No/denies    Pain Score  0-No pain                       [facility] Adult PT Treatment/Exercise - 08/06/18 0001      Manual Therapy   Soft tissue mobilization  to right lateral trunk in area of serratus anterior and lateral border of scapula where pt has increased tightness since pt reports being unable to sleep at night due to pain when lying on left side, then worked just anterior to this area at lateral chest in area of tightness    Myofascial Release  pulling to RUE throughout PROM                  PT Long Term Goals - 07/20/18 1111      PT LONG TERM GOAL #1   Title  Pt will demonstrate 165 degrees of R shoulder flexion to allow her to return to PLOF    Baseline  156, 07/20/18- 153  Time  4    Period  Weeks    Status  On-going      PT LONG TERM GOAL #2   Title  Pt will be independent in a home exercise program for continued strengthening and stretching    Time  4    Period  Weeks    Status  On-going      PT LONG TERM GOAL #3   Title  Pt will report a 50% improvement in swelling in right lateral trunk and inferior to mastectomy scar to allow improved comfort    Baseline  07/20/18- pt reports this is about the same    Time  4    Period  Weeks    Status  On-going      PT LONG TERM GOAL #4   Title  Pt will report a 75% improvement in tightness across chest with UE movements to allow pt to return to PLOF    Baseline  07/20/18- pt reports right after therapy it is better but then tightens back up    Time  4    Period  Weeks    Status  On-going            Plan - 08/06/18 1207    Clinical Impression Statement  Continued with soft tissue mobilization to tight areas in right lateral trunk and inferior to mastectomy scar. These areas are feeling less tight but pt is still having  trouble sleeping on her side.     Rehab Potential  Excellent    PT Frequency  2x / week    PT Duration  4 weeks    PT Treatment/Interventions  ADLs/Self Care Home Management;Therapeutic activities;Therapeutic exercise;Patient/family education;Manual techniques;Manual lymph drainage;Compression bandaging;Scar mobilization;Passive range of motion;Taping    PT Next Visit Plan  give additional posture exercises, cont Rt shoulder ROM at end range flexion, scar mobilization, pec stretches, and then later strength ABC program    PT Home Exercise Plan  self MLD, supine scap    Consulted and Agree with Plan of Care  Patient       Patient will benefit from skilled therapeutic intervention in order to improve the following deficits and impairments:  Increased fascial restricitons, Pain, Decreased scar mobility, Decreased range of motion, Decreased strength, Decreased knowledge of precautions, Increased edema  Visit Diagnosis: Stiffness of right shoulder, not elsewhere classified  Aftercare following surgery for neoplasm     Problem List Patient Active Problem List   Diagnosis Date Noted  . Ductal carcinoma in situ (DCIS) of right breast 03/09/2018  . 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 Fibromyalgia 11/24/2011  . Lumbar degenerative disc disease 11/24/2011    Allyson Sabal [facility] 08/06/2018, 12:09 PM  [place], [place], [address] Phone: [phone]   Fax:  [phone]  Name: Jodi Gutierrez MRN: [id number] Date of Birth: 01-14-1951  Manus Gunning, PT 08/06/18 12:09 PM

## 2018-08-07 ENCOUNTER — Encounter: Payer: 59 | Admitting: Physical Therapy

## 2018-08-11 ENCOUNTER — Encounter: Payer: Self-pay | Admitting: Physical Therapy

## 2018-08-11 ENCOUNTER — Ambulatory Visit: Payer: 59 | Admitting: Physical Therapy

## 2018-08-11 ENCOUNTER — Other Ambulatory Visit: Payer: Self-pay

## 2018-08-11 DIAGNOSIS — Z483 Aftercare following surgery for neoplasm: Secondary | ICD-10-CM

## 2018-08-11 DIAGNOSIS — M25611 Stiffness of right shoulder, not elsewhere classified: Secondary | ICD-10-CM | POA: Diagnosis not present

## 2018-08-11 NOTE — Therapy (Signed)
[place], [place], [address] Phone: [phone]   Fax:  [phone]  Physical Therapy Treatment  Patient Details  Name: Jodi Gutierrez MRN: [id number] Date of Birth: 1951/01/13 Referring Provider (PT): Hoxworth   Encounter Date: 08/11/2018  PT End of Session - 08/11/18 0853    Visit Number  12    Number of Visits  17    Date for PT Re-Evaluation  08/17/18    PT Start Time  0805    PT Stop Time  0845    PT Time Calculation (min)  40 min    Activity Tolerance  Patient tolerated treatment well    Behavior During Therapy  [facility] for tasks assessed/performed       Past Medical History:  Diagnosis Date  . Anemia    hx of as child and early adult   . Arthritis   . Breast cancer in female [facility])    Right  . Claustrophobia   . Fibromyalgia   . H/O fibromyalgia     Past Surgical History:  Procedure Laterality Date  . BUNIONECTOMY     bil  . COLONOSCOPY    . FINGER SURGERY     right ring finger /1994  . FINGER SURGERY    . KNEE ARTHROSCOPY Right   . KNEE SURGERY     left / arthroscopic  . MASTECTOMY W/ SENTINEL NODE BIOPSY Right 04/20/2018   Procedure: RIGHT TOTAL MASTECTOMY WITH AXILLARY SENTINEL LYMPH NODE BIOPSY;  Surgeon: Excell Seltzer, MD;  Location: [place];  Service: General;  Laterality: Right;  . TOTAL KNEE ARTHROPLASTY Right 02/15/2014   Procedure: RIGHT TOTAL KNEE ARTHROPLASTY;  Surgeon: Mauri Pole, MD;  [facility];  Service: Orthopedics;  Laterality: Right;  . TOTAL KNEE ARTHROPLASTY Left 03/25/2016   Procedure: LEFT TOTAL KNEE ARTHROPLASTY;  Surgeon: Paralee Cancel, MD;  [facility];  Service: Orthopedics;  Laterality: Left;  Adductor Block    There were no vitals filed for this visit.  Subjective Assessment - 08/11/18 0807    Subjective  The pain is better. I can sleep on my side some.     Pertinent History  R DCIS with mastectomy SLNB on 04/20/18,no chemo or  radiation  bilateral TKA (2 and 5 years ago), bunion surgery both toes, fibromyalgia, arthritis    Patient Stated Goals  to get back to where I was, stronger    Currently in Pain?  No/denies    Pain Score  0-No pain                       [facility] Adult PT Treatment/Exercise - 08/11/18 0001      Manual Therapy   Soft tissue mobilization  worked just anterior to right lateral trunk in area of tightness, significant improvements made today with softening noted, pt has been able to sleep better at night on her side                  PT Long Term Goals - 07/20/18 1111      PT LONG TERM GOAL #1   Title  Pt will demonstrate 165 degrees of R shoulder flexion to allow her to return to PLOF    Baseline  156, 07/20/18- 153    Time  4    Period  Weeks    Status  On-going      PT LONG TERM GOAL #2   Title  Pt will  be independent in a home exercise program for continued strengthening and stretching    Time  4    Period  Weeks    Status  On-going      PT LONG TERM GOAL #3   Title  Pt will report a 50% improvement in swelling in right lateral trunk and inferior to mastectomy scar to allow improved comfort    Baseline  07/20/18- pt reports this is about the same    Time  4    Period  Weeks    Status  On-going      PT LONG TERM GOAL #4   Title  Pt will report a 75% improvement in tightness across chest with UE movements to allow pt to return to PLOF    Baseline  07/20/18- pt reports right after therapy it is better but then tightens back up    Time  4    Period  Weeks    Status  On-going            Plan - 08/11/18 8546    Clinical Impression Statement  Continued with soft tissue mobilization to area just anterior to right lateral trunk. This area softened significantly and pt has been able to to sleep on her side more often.     Rehab Potential  Excellent    PT Frequency  2x / week    PT Duration  4 weeks    PT Treatment/Interventions  ADLs/Self Care Home  Management;Therapeutic activities;Therapeutic exercise;Patient/family education;Manual techniques;Manual lymph drainage;Compression bandaging;Scar mobilization;Passive range of motion;Taping    PT Next Visit Plan  give additional posture exercises, cont Rt shoulder ROM at end range flexion, scar mobilization, pec stretches, and then later strength ABC program    PT Home Exercise Plan  self MLD, supine scap    Consulted and Agree with Plan of Care  Patient       Patient will benefit from skilled therapeutic intervention in order to improve the following deficits and impairments:  Increased fascial restricitons, Pain, Decreased scar mobility, Decreased range of motion, Decreased strength, Decreased knowledge of precautions, Increased edema  Visit Diagnosis: Aftercare following surgery for neoplasm     Problem List Patient Active Problem List   Diagnosis Date Noted  . Ductal carcinoma in situ (DCIS) of right breast 03/09/2018  . 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 Fibromyalgia 11/24/2011  . Lumbar degenerative disc disease 11/24/2011    Allyson Sabal [facility] 08/11/2018, 8:56 AM  [place], [place], [address] Phone: [phone]   Fax:  [phone]  Name: NELVA HAUK MRN: [id number] Date of Birth: 11-26-1950  Manus Gunning, PT 08/11/18 8:56 AM

## 2018-08-13 ENCOUNTER — Other Ambulatory Visit: Payer: Self-pay

## 2018-08-13 ENCOUNTER — Ambulatory Visit: Payer: 59 | Admitting: Physical Therapy

## 2018-08-13 ENCOUNTER — Encounter: Payer: Self-pay | Admitting: Physical Therapy

## 2018-08-13 DIAGNOSIS — Z483 Aftercare following surgery for neoplasm: Secondary | ICD-10-CM

## 2018-08-13 DIAGNOSIS — M25611 Stiffness of right shoulder, not elsewhere classified: Secondary | ICD-10-CM

## 2018-08-13 DIAGNOSIS — M6281 Muscle weakness (generalized): Secondary | ICD-10-CM

## 2018-08-13 NOTE — Therapy (Signed)
[place], [place], [address] Phone: [phone]   Fax:  [phone]  Physical Therapy Treatment  Patient Details  Name: Jodi Gutierrez MRN: [id number] Date of Birth: September 08, 1950 Referring Provider (PT): Hoxworth   Encounter Date: 08/13/2018  PT End of Session - 08/13/18 1649    Visit Number  13    Number of Visits  17    Date for PT Re-Evaluation  08/17/18    PT Start Time  1603    PT Stop Time  1645    PT Time Calculation (min)  42 min    Activity Tolerance  Patient tolerated treatment well    Behavior During Therapy  [facility] for tasks assessed/performed       Past Medical History:  Diagnosis Date  . Anemia    hx of as child and early adult   . Arthritis   . Breast cancer in female [facility])    Right  . Claustrophobia   . Fibromyalgia   . H/O fibromyalgia     Past Surgical History:  Procedure Laterality Date  . BUNIONECTOMY     bil  . COLONOSCOPY    . FINGER SURGERY     right ring finger /1994  . FINGER SURGERY    . KNEE ARTHROSCOPY Right   . KNEE SURGERY     left / arthroscopic  . MASTECTOMY W/ SENTINEL NODE BIOPSY Right 04/20/2018   Procedure: RIGHT TOTAL MASTECTOMY WITH AXILLARY SENTINEL LYMPH NODE BIOPSY;  Surgeon: Excell Seltzer, MD;  Location: [place];  Service: General;  Laterality: Right;  . TOTAL KNEE ARTHROPLASTY Right 02/15/2014   Procedure: RIGHT TOTAL KNEE ARTHROPLASTY;  Surgeon: Mauri Pole, MD;  [facility];  Service: Orthopedics;  Laterality: Right;  . TOTAL KNEE ARTHROPLASTY Left 03/25/2016   Procedure: LEFT TOTAL KNEE ARTHROPLASTY;  Surgeon: Paralee Cancel, MD;  [facility];  Service: Orthopedics;  Laterality: Left;  Adductor Block    There were no vitals filed for this visit.  Subjective Assessment - 08/13/18 1602    Subjective  I could not sleep on my side again. I think it is the area around the drain.     Pertinent History  R DCIS with mastectomy SLNB on  04/20/18,no chemo or radiation  bilateral TKA (2 and 5 years ago), bunion surgery both toes, fibromyalgia, arthritis    Patient Stated Goals  to get back to where I was, stronger    Currently in Pain?  No/denies    Pain Score  0-No pain                       [facility] Adult PT Treatment/Exercise - 08/13/18 0001      Manual Therapy   Manual therapy comments  educated pt how to obtain a roller massage ball for muscle tightness    Soft tissue mobilization  worked just anterior to right lateral trunk in area of tightness since this area is softening moved just inferior to mastectomy scar in other area of tightness    Myofascial Release  across right chest in area of tightness    Passive ROM  to R shoulder in direction of flexion and abduction while performing myofascial release                  PT Long Term Goals - 07/20/18 1111      PT LONG TERM GOAL #1   Title  Pt will demonstrate 165  degrees of R shoulder flexion to allow her to return to PLOF    Baseline  156, 07/20/18- 153    Time  4    Period  Weeks    Status  On-going      PT LONG TERM GOAL #2   Title  Pt will be independent in a home exercise program for continued strengthening and stretching    Time  4    Period  Weeks    Status  On-going      PT LONG TERM GOAL #3   Title  Pt will report a 50% improvement in swelling in right lateral trunk and inferior to mastectomy scar to allow improved comfort    Baseline  07/20/18- pt reports this is about the same    Time  4    Period  Weeks    Status  On-going      PT LONG TERM GOAL #4   Title  Pt will report a 75% improvement in tightness across chest with UE movements to allow pt to return to PLOF    Baseline  07/20/18- pt reports right after therapy it is better but then tightens back up    Time  4    Period  Weeks    Status  On-going            Plan - 08/13/18 1650    Clinical Impression Statement  Continued with manual therapy to decrease  tightness across right chest. Pt is still having increased pec tightness with end range R shoulder ROM. Educated pt to purchase a Physicist, medical that she can use over the next two weeks in tight areas since this clinic will be closed for at least 2 weeks due to coronavirus.     Rehab Potential  Excellent    PT Frequency  2x / week    PT Duration  4 weeks    PT Treatment/Interventions  ADLs/Self Care Home Management;Therapeutic activities;Therapeutic exercise;Patient/family education;Manual techniques;Manual lymph drainage;Compression bandaging;Scar mobilization;Passive range of motion;Taping    PT Next Visit Plan  give additional posture exercises, cont Rt shoulder ROM at end range flexion, scar mobilization, pec stretches, and then later strength ABC program    PT Home Exercise Plan  self MLD, supine scap    Consulted and Agree with Plan of Care  Patient       Patient will benefit from skilled therapeutic intervention in order to improve the following deficits and impairments:  Increased fascial restricitons, Pain, Decreased scar mobility, Decreased range of motion, Decreased strength, Decreased knowledge of precautions, Increased edema  Visit Diagnosis: Stiffness of right shoulder, not elsewhere classified  Aftercare following surgery for neoplasm  Muscle weakness (generalized)     Problem List Patient Active Problem List   Diagnosis Date Noted  . Ductal carcinoma in situ (DCIS) of right breast 03/09/2018  . 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 Fibromyalgia 11/24/2011  . Lumbar degenerative disc disease 11/24/2011    Allyson Sabal [facility] 08/13/2018, 4:51 PM  [place], [place], [address] Phone: [phone]   Fax:  [phone]  Name: Jodi Gutierrez MRN: [id number] Date of Birth: [birth year]  Manus Gunning, PT 08/13/18 4:52 PM

## 2018-08-14 ENCOUNTER — Encounter: Payer: Self-pay | Admitting: *Deleted

## 2018-08-14 ENCOUNTER — Telehealth: Payer: Self-pay | Admitting: Adult Health

## 2018-08-14 NOTE — Telephone Encounter (Signed)
Called regarding 7/14 °

## 2018-08-17 ENCOUNTER — Ambulatory Visit: Payer: 59 | Admitting: Physical Therapy

## 2018-08-18 ENCOUNTER — Ambulatory Visit: Payer: 59

## 2018-08-20 ENCOUNTER — Encounter: Payer: 59 | Admitting: Physical Therapy

## 2018-08-27 ENCOUNTER — Telehealth: Payer: Self-pay | Admitting: Physical Therapy

## 2018-08-27 NOTE — Telephone Encounter (Signed)
Called and spoke with patient about doing an evisit when possible. Pt was interested in this. She would like to be instructed in Strength ABC program once that is available. She has no urgent needs at this time.  [facility] [place], [place] 08/27/18 1:10 PM

## 2018-09-03 ENCOUNTER — Encounter: Payer: 59 | Admitting: Physical Therapy

## 2018-09-07 ENCOUNTER — Ambulatory Visit: Payer: Medicare Other | Admitting: Physical Therapy

## 2018-09-21 ENCOUNTER — Telehealth: Payer: Self-pay | Admitting: *Deleted

## 2018-09-21 NOTE — Telephone Encounter (Signed)
Received call from pt stating that she has had increase in fatigue and severe joint pain.  Pt states she does go to a physical therapist but she has gotten to the point where she doesn't want to get out of bed in the morning because the fatigue is so severe.  Pt states that overall she is a very active person and goes hiking and kayaking with her husband and the joint pain is interfering with her ADL's.  Pt educated to stop Tamoxifen for 2 weeks to see if symptoms improve and to call us to keep [place] updated.  If symptoms improve we can talk with Dr. Lindi Adie to see what pts other options are for anti estrogen therapy.

## 2018-09-28 ENCOUNTER — Other Ambulatory Visit: Payer: Self-pay

## 2018-09-28 ENCOUNTER — Ambulatory Visit: Payer: Medicare Other | Attending: General Surgery

## 2018-09-28 DIAGNOSIS — R600 Localized edema: Secondary | ICD-10-CM | POA: Diagnosis present

## 2018-09-28 DIAGNOSIS — M6281 Muscle weakness (generalized): Secondary | ICD-10-CM

## 2018-09-28 DIAGNOSIS — M25611 Stiffness of right shoulder, not elsewhere classified: Secondary | ICD-10-CM

## 2018-09-28 DIAGNOSIS — Z483 Aftercare following surgery for neoplasm: Secondary | ICD-10-CM | POA: Diagnosis present

## 2018-09-28 NOTE — Patient Instructions (Signed)

## 2018-09-28 NOTE — Therapy (Signed)
[place], [place], [address] Phone: [phone]   Fax:  [phone]  Physical Therapy Treatment  Patient Details  Name: Jodi Gutierrez MRN: [id number] Date of Birth: June 30, 1950 Referring Provider (PT): Hoxworth   Encounter Date: 09/28/2018  PT End of Session - 09/28/18 1052    Visit Number  14    Number of Visits  25    Date for PT Re-Evaluation  10/26/18    PT Start Time  0800    PT Stop Time  0855    PT Time Calculation (min)  55 min    Activity Tolerance  Patient tolerated treatment well    Behavior During Therapy  [facility] for tasks assessed/performed       Past Medical History:  Diagnosis Date  . Anemia    hx of as child and early adult   . Arthritis   . Breast cancer in female [facility])    Right  . Claustrophobia   . Fibromyalgia   . H/O fibromyalgia     Past Surgical History:  Procedure Laterality Date  . BUNIONECTOMY     bil  . COLONOSCOPY    . FINGER SURGERY     right ring finger /1994  . FINGER SURGERY    . KNEE ARTHROSCOPY Right   . KNEE SURGERY     left / arthroscopic  . MASTECTOMY W/ SENTINEL NODE BIOPSY Right 04/20/2018   Procedure: RIGHT TOTAL MASTECTOMY WITH AXILLARY SENTINEL LYMPH NODE BIOPSY;  Surgeon: Excell Seltzer, MD;  Location: [place];  Service: General;  Laterality: Right;  . TOTAL KNEE ARTHROPLASTY Right 02/15/2014   Procedure: RIGHT TOTAL KNEE ARTHROPLASTY;  Surgeon: Mauri Pole, MD;  [facility];  Service: Orthopedics;  Laterality: Right;  . TOTAL KNEE ARTHROPLASTY Left 03/25/2016   Procedure: LEFT TOTAL KNEE ARTHROPLASTY;  Surgeon: Paralee Cancel, MD;  Location[facility];  Service: Orthopedics;  Laterality: Left;  Adductor Block    There were no vitals filed for this visit.  Subjective Assessment - 09/28/18 0807    Subjective  I officially retired last Thursday. My Rt arm has been aching and really bothering me bad for about 3 weeks. The pain has even been  down into my fingers a few times but this has resolved over past week. Still trying to sail some but I have been trying to rest it. I know what did it, I was doing yardwork and was doing a banging motion repetitively and it's hurt since then. I know my fibromyalgia hasn't helped it calm down either.     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  Yes    Pain Score  8     Pain Location  Arm    Pain Orientation  Right;Upper    Pain Descriptors / Indicators  Aching    Pain Type  Acute pain    Pain Onset  1 to 4 weeks ago    Pain Relieving Factors  everything hurts, reaching wrong, laying on it    Effect of Pain on Daily Activities  Tylenol         [facility] PT Assessment - 09/28/18 0001      AROM   Right Shoulder Flexion  159 Degrees  Right Shoulder ABduction  179 Degrees    Right Shoulder Internal Rotation  65 Degrees    Right Shoulder External Rotation  90 Degrees                   [facility] Adult PT Treatment/Exercise - 09/28/18 0001      Shoulder Exercises: Isometric Strengthening   Flexion  3X3"   all isometrics done standing in doorway with Rt UE    Extension  3X3"    External Rotation  3X3"    ABduction  3X3"    ABduction Limitations  Pt returned therapist demonstration of all with VCs to not push into pain.       Manual Therapy   Manual Therapy  Myofascial release;Passive ROM;Neural Stretch;Joint mobilization    Joint Mobilization  To Rt shoulder inferiorly and posteriorly grades I-II to decrease acute pain pt was c/o start of session    Myofascial Release  across right chest in area of tightness, but also in Rt axilla where cording was eventually palpable as I was able to stretch pt further towards her end ROM    Passive ROM  to Rt shoulder in direction of  flexion and abduction while performing myofascial release; briefly attempted D2 at end of session and pt was able to tolerate this well as her pain had improved from start of session    Neural Stretch  To Rt UE              PT Education - 09/28/18 1049    Education Details  Instructed pt briefly in isometrics for Rt shoulder at end of session and reviewed importance of continuing stretching throughout day but not into pain. ALso showed her ways to modify stretching to decrease pain, like seated table stretches instead of using doorway. Also During stretching explained to pt that bc of her fibromyalgia her flare up will probably take longer to calm down, also bc she has still been fairly active since then [employer] and sailing).     Person(s) Educated  Patient    Methods  Explanation;Demonstration;Verbal cues;Handout    Comprehension  Verbalized understanding;Returned demonstration          PT Long Term Goals - 09/28/18 0855      PT LONG TERM GOAL #1   Title  Pt will demonstrate 165 degrees of R shoulder flexion to allow her to return to PLOF    Baseline  156, 07/20/18- 153; 159 degrees-09/28/18    Status  On-going      PT LONG TERM GOAL #2   Title  Pt will be independent in a home exercise program for continued strengthening and stretching    Status  On-going      PT LONG TERM GOAL #3   Title  Pt will report a 50% improvement in swelling in right lateral trunk and inferior to mastectomy scar to allow improved comfort    Baseline  07/20/18- pt reports this is about the same; 60-70% improvement at this time, just feels more tightness at chest wall now-09/28/18    Status  Achieved      PT LONG TERM GOAL #4   Title  Pt will report a 75% improvement in tightness across chest with UE movements to allow pt to return to PLOF    Baseline  Pt reports 60-70% improvement with tightness-09/28/18    Status  Partially Met      PT LONG TERM GOAL #5   Title  Pt will report a  25% improvement of  pain in her Rt UE with UE movements to allow pt to return to PLOF    Baseline  80% pain throughout day-09/28/18    Time  4    Period  Weeks    Status  New      Additional Long Term Goals   Additional Long Term Goals  Yes      PT LONG TERM GOAL #6   Title  Pt will report pain no more than 4/10 after light yard work     Baseline  8-10/10 depending on activity-09/28/18    Time  4    Period  Weeks    Status  New            Plan - 09/28/18 1053    Clinical Impression Statement  Pt has not been seen in clinic since 08/13/18 due to [place] pandemic. She returns today with new flare up of Rt shoulder pain that she reports feeling about 80% of her day limitng her ADLs and hobbies. Her A/ROM was not changed, in fact had improved a few degrees in flexion and abduction but cording was noticed towards end ROM in axilla which pt did not have before. She had been experiencing pain down her whole arm with tingling into fingers (which can be symptoms of cording) but she reports with some rest the tingling has resolved over past week or so. Pt did report treatment today beneficial as her pain was less and her ROM felt less guarded (as hse noticed she had been guarding her motions). She would like to resume therapy and come 2x/wk to decrease her new flare up of pain and get back to yard work with less pain after. Pt has also agreed to rest arm alot more over next 1-2 weeks to allow for better healing.     Rehab Potential  Excellent    PT Frequency  2x / week    PT Duration  4 weeks    PT Treatment/Interventions  ADLs/Self Care Home Management;Therapeutic activities;Therapeutic exercise;Patient/family education;Manual techniques;Manual lymph drainage;Compression bandaging;Scar mobilization;Passive range of motion;Taping    PT Next Visit Plan  Renewal doen today. Focus on manual therapy initially until pts pain subsides focusing on Rt UE end ROM and cording in axilla, review isometrics prn and, when able, instruct  in Strength ABC.     PT Home Exercise Plan  Isometrics issued today due to new pain-09/28/18    Consulted and Agree with Plan of Care  Patient       Patient will benefit from skilled therapeutic intervention in order to improve the following deficits and impairments:  Increased fascial restricitons, Pain, Decreased scar mobility, Decreased range of motion, Decreased strength, Decreased knowledge of precautions, Increased edema  Visit Diagnosis: Stiffness of right shoulder, not elsewhere classified  Aftercare following surgery for neoplasm  Muscle weakness (generalized)  Localized edema     Problem List Patient Active Problem List   Diagnosis Date Noted  . Ductal carcinoma in situ (DCIS) of right breast 03/09/2018  . 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 Fibromyalgia 11/24/2011  . Lumbar degenerative disc disease 11/24/2011    Otelia Limes, PTA 09/28/2018, 1:33 PM  [place], [place], [address] Phone: [phone]   Fax:  [phone]  Name: TOMMA EHINGER MRN: [id number] Date of Birth: 07-23-1950

## 2018-09-30 ENCOUNTER — Ambulatory Visit: Payer: Medicare Other

## 2018-09-30 ENCOUNTER — Other Ambulatory Visit: Payer: Self-pay

## 2018-09-30 DIAGNOSIS — M25611 Stiffness of right shoulder, not elsewhere classified: Secondary | ICD-10-CM | POA: Diagnosis not present

## 2018-09-30 DIAGNOSIS — M6281 Muscle weakness (generalized): Secondary | ICD-10-CM

## 2018-09-30 DIAGNOSIS — R600 Localized edema: Secondary | ICD-10-CM

## 2018-09-30 DIAGNOSIS — Z483 Aftercare following surgery for neoplasm: Secondary | ICD-10-CM

## 2018-09-30 NOTE — Therapy (Signed)
[place], [place], [address] Phone: [phone]   Fax:  [phone]  Physical Therapy Treatment  Patient Details  Name: Jodi Gutierrez MRN: [id number] Date of Birth: 09-23-1950 Referring Provider (PT): Hoxworth   Encounter Date: 09/30/2018  PT End of Session - 09/30/18 1104    Visit Number  15    Number of Visits  25    Date for PT Re-Evaluation  10/26/18    PT Start Time  1000    PT Stop Time  1055    PT Time Calculation (min)  55 min    Activity Tolerance  Patient tolerated treatment well    Behavior During Therapy  [facility] for tasks assessed/performed       Past Medical History:  Diagnosis Date  . Anemia    hx of as child and early adult   . Arthritis   . Breast cancer in female [facility])    Right  . Claustrophobia   . Fibromyalgia   . H/O fibromyalgia     Past Surgical History:  Procedure Laterality Date  . BUNIONECTOMY     bil  . COLONOSCOPY    . FINGER SURGERY     right ring finger /1994  . FINGER SURGERY    . KNEE ARTHROSCOPY Right   . KNEE SURGERY     left / arthroscopic  . MASTECTOMY W/ SENTINEL NODE BIOPSY Right 04/20/2018   Procedure: RIGHT TOTAL MASTECTOMY WITH AXILLARY SENTINEL LYMPH NODE BIOPSY;  Surgeon: Excell Seltzer, MD;  Location: [place];  Service: General;  Laterality: Right;  . TOTAL KNEE ARTHROPLASTY Right 02/15/2014   Procedure: RIGHT TOTAL KNEE ARTHROPLASTY;  Surgeon: Mauri Pole, MD;  [facility];  Service: Orthopedics;  Laterality: Right;  . TOTAL KNEE ARTHROPLASTY Left 03/25/2016   Procedure: LEFT TOTAL KNEE ARTHROPLASTY;  Surgeon: Paralee Cancel, MD;  [facility];  Service: Orthopedics;  Laterality: Left;  Adductor Block    There were no vitals filed for this visit.  Subjective Assessment - 09/30/18 1015    Subjective  My Rt arm did feel better after Mondays session and I was able to sleep a little better and Tuesday was even less painful. I was back  to having trouble sleeping last night though.     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  Yes    Pain Score  8     Pain Location  Arm    Pain Orientation  Right;Upper    Pain Descriptors / Indicators  Aching    Pain Type  Acute pain    Pain Onset  1 to 4 weeks ago    Pain Frequency  Constant    Pain Relieving Factors  everything hurts, reaching wrong/taking shirt off    Effect of Pain on Daily Activities  Mondays session really helped                       [facility] Adult PT Treatment/Exercise - 09/30/18 0001      Manual Therapy   Joint Mobilization  To Rt shoulder inferiorly and posteriorly grades I-II to decrease joint capsule tightness    Myofascial Release  across right chest in area of tightness, but also  in Rt axilla where cording still palpable towards her available end P/ROM    Passive ROM  to Rt shoulder in direction of flexion, abduction, and D2 with myofascial release    Neural Stretch  To Rt UE                   PT Long Term Goals - 09/28/18 0855      PT LONG TERM GOAL #1   Title  Pt will demonstrate 165 degrees of R shoulder flexion to allow her to return to PLOF    Baseline  156, 07/20/18- 153; 159 degrees-09/28/18    Status  On-going      PT LONG TERM GOAL #2   Title  Pt will be independent in a home exercise program for continued strengthening and stretching    Status  On-going      PT LONG TERM GOAL #3   Title  Pt will report a 50% improvement in swelling in right lateral trunk and inferior to mastectomy scar to allow improved comfort    Baseline  07/20/18- pt reports this is about the same; 60-70% improvement at this time, just feels more tightness at chest wall now-09/28/18    Status  Achieved      PT LONG TERM GOAL #4   Title  Pt  will report a 75% improvement in tightness across chest with UE movements to allow pt to return to PLOF    Baseline  Pt reports 60-70% improvement with tightness-09/28/18    Status  Partially Met      PT LONG TERM GOAL #5   Title  Pt will report a 25% improvement of pain in her Rt UE with UE movements to allow pt to return to PLOF    Baseline  80% pain throughout day-09/28/18    Time  4    Period  Weeks    Status  New      Additional Long Term Goals   Additional Long Term Goals  Yes      PT LONG TERM GOAL #6   Title  Pt will report pain no more than 4/10 after light yard work     Baseline  8-10/10 depending on activity-09/28/18    Time  4    Period  Weeks    Status  New            Plan - 09/30/18 1104    Clinical Impression Statement  Pt reports fairly significant improvement after last visit that lasted into the following day. So continued with manual therapy. Also found out that pt has TENS unit so educated her how this would very beneficial to use to help decrease her Rt shoulder pain. Instructed her during manual therapy of best placement of electrodes and to try for "X" pattern covering shoulder and upper arm, also that she could use her heat pack over this area for short periods of time. Pt verbalized understanding this and was excited to try this when she gets home. Also discussed iontophoresis with pt as she is tender to palpation at anterior aspect of joint capsule and this could prove beneficial to this area. She is interested in trying this if pain has not improved some overall by Monday Shan Levans, PT is agreeable and will send order for this to doctor) with use of TENS over the weekend. She would also like a to get a compression sleeve so will fax order to Dr. Excell Seltzer today for this.  Rehab Potential  Excellent    PT Frequency  2x / week    PT Duration  4 weeks    PT Treatment/Interventions  ADLs/Self Care Home Management;Therapeutic activities;Therapeutic  exercise;Patient/family education;Manual techniques;Manual lymph drainage;Compression bandaging;Scar mobilization;Passive range of motion;Taping    PT Next Visit Plan  See if pts pain improved over weekend with use ofher TENS unit, if not begin ionto IF order returned signed. Cont with manual therapy focusing on myofascial release to Rt axilla where cording palpable and end P/ROM; assess isometrics and when able, instruct in Strength ABC program later.    Consulted and Agree with Plan of Care  Patient       Patient will benefit from skilled therapeutic intervention in order to improve the following deficits and impairments:  Increased fascial restricitons, Pain, Decreased scar mobility, Decreased range of motion, Decreased strength, Decreased knowledge of precautions, Increased edema  Visit Diagnosis: Stiffness of right shoulder, not elsewhere classified  Aftercare following surgery for neoplasm  Muscle weakness (generalized)  Localized edema     Problem List Patient Active Problem List   Diagnosis Date Noted  . Ductal carcinoma in situ (DCIS) of right breast 03/09/2018  . 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 Fibromyalgia 11/24/2011  . Lumbar degenerative disc disease 11/24/2011    Jodi Gutierrez, Jodi Gutierrez 09/30/2018, 11:31 AM  [place], [place], [address] Phone: [phone]   Fax:  [phone]  Name: Jodi Gutierrez MRN: [id number] Date of Birth: May 27, 1951

## 2018-10-01 ENCOUNTER — Encounter

## 2018-10-07 ENCOUNTER — Ambulatory Visit: Payer: Medicare Other

## 2018-10-07 ENCOUNTER — Other Ambulatory Visit: Payer: Self-pay

## 2018-10-07 DIAGNOSIS — M25611 Stiffness of right shoulder, not elsewhere classified: Secondary | ICD-10-CM

## 2018-10-07 DIAGNOSIS — M6281 Muscle weakness (generalized): Secondary | ICD-10-CM

## 2018-10-07 DIAGNOSIS — R600 Localized edema: Secondary | ICD-10-CM

## 2018-10-07 DIAGNOSIS — Z483 Aftercare following surgery for neoplasm: Secondary | ICD-10-CM

## 2018-10-07 NOTE — Therapy (Signed)
[place], [place], [address] Phone: [phone]   Fax:  [phone]  Physical Therapy Treatment  Patient Details  Name: Jodi Gutierrez MRN: [id number] Date of Birth: 1951-01-05 Referring Provider (PT): Hoxworth   Encounter Date: 10/07/2018  PT End of Session - 10/07/18 0855    Visit Number  16   add KX   Number of Visits  25    Date for PT Re-Evaluation  10/26/18    PT Start Time  0800    PT Stop Time  0848    PT Time Calculation (min)  48 min    Activity Tolerance  Patient tolerated treatment well    Behavior During Therapy  [facility] for tasks assessed/performed       Past Medical History:  Diagnosis Date  . Anemia    hx of as child and early adult   . Arthritis   . Breast cancer in female [facility])    Right  . Claustrophobia   . Fibromyalgia   . H/O fibromyalgia     Past Surgical History:  Procedure Laterality Date  . BUNIONECTOMY     bil  . COLONOSCOPY    . FINGER SURGERY     right ring finger /1994  . FINGER SURGERY    . KNEE ARTHROSCOPY Right   . KNEE SURGERY     left / arthroscopic  . MASTECTOMY W/ SENTINEL NODE BIOPSY Right 04/20/2018   Procedure: RIGHT TOTAL MASTECTOMY WITH AXILLARY SENTINEL LYMPH NODE BIOPSY;  Surgeon: Excell Seltzer, MD;  Location: [place];  Service: General;  Laterality: Right;  . TOTAL KNEE ARTHROPLASTY Right 02/15/2014   Procedure: RIGHT TOTAL KNEE ARTHROPLASTY;  Surgeon: Mauri Pole, MD;  [facility];  Service: Orthopedics;  Laterality: Right;  . TOTAL KNEE ARTHROPLASTY Left 03/25/2016   Procedure: LEFT TOTAL KNEE ARTHROPLASTY;  Surgeon: Paralee Cancel, MD;  [facility];  Service: Orthopedics;  Laterality: Left;  Adductor Block    There were no vitals filed for this visit.  Subjective Assessment - 10/07/18 0807    Subjective  I think I must've pulled a tendon or something in my arm, it hurts to even cut my pancales with a fork! The TENS unit is  helping some, but it just hurts all the time. I think I need to see the doctor about it.     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  Yes    Pain Score  6     Pain Location  Arm    Pain Orientation  Right;Upper    Pain Descriptors / Indicators  Aching;Sharp    Pain Type  Acute pain    Pain Onset  More than a month ago    Pain Frequency  Constant    Pain Relieving Factors  everything hurts, reaching wrong/taking off shirt, cutting with my fork    Effect of Pain on Daily Activities  TENS and PT is helping some                       [facility] Adult PT Treatment/Exercise - 10/07/18 0001      Manual Therapy   Joint Mobilization  To Rt shoulder inferiorly and posteriorly grades I-II  to decrease joint capsule tightness    Myofascial Release  across right chest in area of tightness, but also in Rt axilla where cording still palpable towards her available end P/ROM    Manual Lymphatic Drainage (MLD)  In Supine: Short neck, 5 diaphragmatic breaths, Lt axillary nodes and Rt inguinal nodes, then anterior inter-axillary and Rt axillo-inguinal anastomosis and focused on rt chest wall, especially inferior to incision    Passive ROM  to Rt shoulder in direction of flexion, abduction, and D2 with myofascial release    Neural Stretch  --                  PT Long Term Goals - 09/28/18 0855      PT LONG TERM GOAL #1   Title  Pt will demonstrate 165 degrees of R shoulder flexion to allow her to return to PLOF    Baseline  156, 07/20/18- 153; 159 degrees-09/28/18    Status  On-going      PT LONG TERM GOAL #2   Title  Pt will be independent in a home exercise program for continued strengthening and stretching    Status  On-going      PT LONG TERM GOAL #3   Title  Pt will  report a 50% improvement in swelling in right lateral trunk and inferior to mastectomy scar to allow improved comfort    Baseline  07/20/18- pt reports this is about the same; 60-70% improvement at this time, just feels more tightness at chest wall now-09/28/18    Status  Achieved      PT LONG TERM GOAL #4   Title  Pt will report a 75% improvement in tightness across chest with UE movements to allow pt to return to PLOF    Baseline  Pt reports 60-70% improvement with tightness-09/28/18    Status  Partially Met      PT LONG TERM GOAL #5   Title  Pt will report a 25% improvement of pain in her Rt UE with UE movements to allow pt to return to PLOF    Baseline  80% pain throughout day-09/28/18    Time  4    Period  Weeks    Status  New      Additional Long Term Goals   Additional Long Term Goals  Yes      PT LONG TERM GOAL #6   Title  Pt will report pain no more than 4/10 after light yard work     Baseline  8-10/10 depending on activity-09/28/18    Time  4    Period  Weeks    Status  New            Plan - 10/07/18 0859    Clinical Impression Statement  Pt reports the use of her TENS unit has helped some with her pain as have the PT sessions (good relief noted again today at end of session) but reports this is all short lived. She doesn't ever not have pain and it is affecting most of her ADLs. She has also been doing the isometric exercises at home and reports pain with these as well. Suggested pt update her PCP as this pain has been ongoing for about 6 weeks now and gentle therapeutic efforts have only helped minimally. Pt is agreeable to this. Also suggested she put in a follow up call to Dr. Excell Seltzer (prescribing doctor for PT) as her renewal has yet to be signed and we can not  begin trial of iontophoresis until then, pt also agreeable to this. Continued with manual therapy as exercises are painful right now and pt is getting some relief from manual techniques.     Rehab Potential  Excellent     PT Frequency  2x / week    PT Duration  4 weeks    PT Treatment/Interventions  ADLs/Self Care Home Management;Therapeutic activities;Therapeutic exercise;Patient/family education;Manual techniques;Manual lymph drainage;Compression bandaging;Scar mobilization;Passive range of motion;Taping    PT Next Visit Plan  See if pt called PCP and what did they say...begin ionto IF order returned signed. Cont with manual therapy focusing on myofascial release to Rt axilla where cording palpable and end P/ROM; assess isometrics and when able, instruct in Strength ABC program later.    Consulted and Agree with Plan of Care  Patient       Patient will benefit from skilled therapeutic intervention in order to improve the following deficits and impairments:  Increased fascial restricitons, Pain, Decreased scar mobility, Decreased range of motion, Decreased strength, Decreased knowledge of precautions, Increased edema  Visit Diagnosis: Stiffness of right shoulder, not elsewhere classified  Aftercare following surgery for neoplasm  Muscle weakness (generalized)  Localized edema     Problem List Patient Active Problem List   Diagnosis Date Noted  . Ductal carcinoma in situ (DCIS) of right breast 03/09/2018  . 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 Fibromyalgia 11/24/2011  . Lumbar degenerative disc disease 11/24/2011    Otelia Limes, PTA 10/07/2018, 10:19 AM  [place], [place], [address] Phone: [phone]   Fax:  [phone]  Name: BRIGITTA PRICER MRN: [id number] Date of Birth: 02-18-1951

## 2018-10-09 ENCOUNTER — Other Ambulatory Visit: Payer: Self-pay

## 2018-10-09 ENCOUNTER — Ambulatory Visit: Payer: Medicare Other

## 2018-10-09 DIAGNOSIS — M6281 Muscle weakness (generalized): Secondary | ICD-10-CM

## 2018-10-09 DIAGNOSIS — M25611 Stiffness of right shoulder, not elsewhere classified: Secondary | ICD-10-CM | POA: Diagnosis not present

## 2018-10-09 DIAGNOSIS — Z483 Aftercare following surgery for neoplasm: Secondary | ICD-10-CM

## 2018-10-09 DIAGNOSIS — R600 Localized edema: Secondary | ICD-10-CM

## 2018-10-09 NOTE — Therapy (Signed)
[place], [place], [address] Phone: [phone]   Fax:  [phone]  Physical Therapy Treatment  Patient Details  Name: FAIGE SEELY MRN: [id number] Date of Birth: 05-Sep-1950 Referring Provider (PT): Hoxworth   Encounter Date: 10/09/2018  PT End of Session - 10/09/18 0857    Visit Number  17   add kx   Number of Visits  25    Date for PT Re-Evaluation  10/26/18    PT Start Time  0801    PT Stop Time  0851    PT Time Calculation (min)  50 min    Activity Tolerance  Patient tolerated treatment well;Patient limited by pain    Behavior During Therapy  [facility] for tasks assessed/performed       Past Medical History:  Diagnosis Date  . Anemia    hx of as child and early adult   . Arthritis   . Breast cancer in female [facility])    Right  . Claustrophobia   . Fibromyalgia   . H/O fibromyalgia     Past Surgical History:  Procedure Laterality Date  . BUNIONECTOMY     bil  . COLONOSCOPY    . FINGER SURGERY     right ring finger /1994  . FINGER SURGERY    . KNEE ARTHROSCOPY Right   . KNEE SURGERY     left / arthroscopic  . MASTECTOMY W/ SENTINEL NODE BIOPSY Right 04/20/2018   Procedure: RIGHT TOTAL MASTECTOMY WITH AXILLARY SENTINEL LYMPH NODE BIOPSY;  Surgeon: Excell Seltzer, MD;  Location: [place];  Service: General;  Laterality: Right;  . TOTAL KNEE ARTHROPLASTY Right 02/15/2014   Procedure: RIGHT TOTAL KNEE ARTHROPLASTY;  Surgeon: Mauri Pole, MD;  [facility];  Service: Orthopedics;  Laterality: Right;  . TOTAL KNEE ARTHROPLASTY Left 03/25/2016   Procedure: LEFT TOTAL KNEE ARTHROPLASTY;  Surgeon: Paralee Cancel, MD;  Location[facility];  Service: Orthopedics;  Laterality: Left;  Adductor Block    There were no vitals filed for this visit.  Subjective Assessment - 10/09/18 0812    Subjective  I saw my primary doctor who did an xray that showed damage to my Rt shoulder so they have set me up  to see an orthopedist, waiting on call for that appt. My shoulder is just not good. It didn't feel great after last session. I'm glad I started the process of seeing my doctor.     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  Yes    Pain Score  6     Pain Location  Shoulder    Pain Orientation  Right    Pain Descriptors / Indicators  Aching;Sharp    Pain Type  Acute pain    Pain Onset  More than a month ago    Pain Frequency  Constant    Pain Relieving Factors  everything hurts, reaching wrong/taking off shirt, cutting with my fork/knife    Effect of Pain on Daily Activities  TENS and PT is helping some                       [facility] Adult PT Treatment/Exercise - 10/09/18 0001      Manual Therapy  Soft tissue mobilization  With thick massage lotion to Rt upper arm/bicep area where tender trigger points palpable.    Myofascial Release  across right chest in area of tightness, but also in Rt axilla where cording still palpable towards her available end P/ROM, and to lateral chest wall with end range flex and abd    Passive ROM  to Rt shoulder in direction of flexion, abduction, and D2 with myofascial release    Neural Stretch  To Rt UE but painful after 2-3 stretches so stopped                  PT Long Term Goals - 09/28/18 0855      PT LONG TERM GOAL #1   Title  Pt will demonstrate 165 degrees of R shoulder flexion to allow her to return to PLOF    Baseline  156, 07/20/18- 153; 159 degrees-09/28/18    Status  On-going      PT LONG TERM GOAL #2   Title  Pt will be independent in a home exercise program for continued strengthening and stretching    Status  On-going      PT LONG TERM GOAL #3   Title  Pt will report a 50% improvement in swelling in right  lateral trunk and inferior to mastectomy scar to allow improved comfort    Baseline  07/20/18- pt reports this is about the same; 60-70% improvement at this time, just feels more tightness at chest wall now-09/28/18    Status  Achieved      PT LONG TERM GOAL #4   Title  Pt will report a 75% improvement in tightness across chest with UE movements to allow pt to return to PLOF    Baseline  Pt reports 60-70% improvement with tightness-09/28/18    Status  Partially Met      PT LONG TERM GOAL #5   Title  Pt will report a 25% improvement of pain in her Rt UE with UE movements to allow pt to return to PLOF    Baseline  80% pain throughout day-09/28/18    Time  4    Period  Weeks    Status  New      Additional Long Term Goals   Additional Long Term Goals  Yes      PT LONG TERM GOAL #6   Title  Pt will report pain no more than 4/10 after light yard work     Baseline  8-10/10 depending on activity-09/28/18    Time  4    Period  Weeks    Status  New            Plan - 10/09/18 8110    Clinical Impression Statement  Pt continues to verbalize being very frustrated with her limitations of this newer Rt shoulder pain over past 6 weeks. She reached out to Dr. Lear Ng office to request signature for recert but this, including ionto, remains unsigned so sent an inbox to Dr. Excell Seltzer updating him on this and that pt has seen her primary regarding shoulder pain. Today continued with focus on manual therapy including soft tissue mobs to Rt upper arm today as well. Pt reported feeling great benefit of this and decreased pain at end of session. She has seen her primary doctor, Lilyan Gilford, [place] at [facility] who did an xray that showed "damage" and has been referred to an orthopedist. Pt is waiting to hear from them for that appt. Did not  place pt on hold until then bc she is reporting good, though short lasting, symptomatic relief after each session. Continued to encourage pt to work within pain free ROM  and stretch as able. Also continue with use of her TENS unit as she reports this has been very beneficial, and to incorporate use of ice pack as well to help further decrease any inflammation, especially after prolonged activities. Also discussed best placement of electrodes, including where we worked on trigger point release today at upper lateral arm. Pt verbalized understanding all.     Rehab Potential  Excellent    PT Frequency  2x / week    PT Duration  4 weeks    PT Treatment/Interventions  ADLs/Self Care Home Management;Therapeutic activities;Therapeutic exercise;Patient/family education;Manual techniques;Manual lymph drainage;Compression bandaging;Scar mobilization;Passive range of motion;Taping    PT Next Visit Plan  Appt with orthopedist yet/what did they say if so???...begin ionto IF order returned signed. Cont with manual therapy focusing on myofascial release to Rt axilla where cording palpable and end P/ROM; assess isometrics and when able, instruct in Strength ABC program later.    Consulted and Agree with Plan of Care  Patient       Patient will benefit from skilled therapeutic intervention in order to improve the following deficits and impairments:  Increased fascial restricitons, Pain, Decreased scar mobility, Decreased range of motion, Decreased strength, Decreased knowledge of precautions, Increased edema  Visit Diagnosis: Stiffness of right shoulder, not elsewhere classified  Aftercare following surgery for neoplasm  Muscle weakness (generalized)  Localized edema     Problem List Patient Active Problem List   Diagnosis Date Noted  . Ductal carcinoma in situ (DCIS) of right breast 03/09/2018  . 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 Fibromyalgia 11/24/2011  . Lumbar degenerative disc disease 11/24/2011    Otelia Limes, PTA 10/09/2018, 9:07 AM  [place], [place], [address] Phone:  [phone]   Fax:  [phone]  Name: KANDIE KEIPER MRN: [id number] Date of Birth: 1950-11-06

## 2018-10-12 ENCOUNTER — Other Ambulatory Visit: Payer: Self-pay

## 2018-10-12 ENCOUNTER — Ambulatory Visit: Payer: Medicare Other

## 2018-10-12 DIAGNOSIS — Z483 Aftercare following surgery for neoplasm: Secondary | ICD-10-CM

## 2018-10-12 DIAGNOSIS — M25611 Stiffness of right shoulder, not elsewhere classified: Secondary | ICD-10-CM | POA: Diagnosis not present

## 2018-10-12 DIAGNOSIS — M6281 Muscle weakness (generalized): Secondary | ICD-10-CM

## 2018-10-12 DIAGNOSIS — R600 Localized edema: Secondary | ICD-10-CM

## 2018-10-12 NOTE — Therapy (Signed)
[place], [place], [address] Phone: [phone]   Fax:  [phone]  Physical Therapy Treatment  Patient Details  Name: Jodi Gutierrez MRN: [id number] Date of Birth: 1951/03/10 Referring Provider (PT): Hoxworth   Encounter Date: 10/12/2018  PT End of Session - 10/12/18 0852    Visit Number  18   add KX   Number of Visits  25    Date for PT Re-Evaluation  10/26/18    PT Start Time  0802    PT Stop Time  0848    PT Time Calculation (min)  46 min    Activity Tolerance  Patient tolerated treatment well;Patient limited by pain    Behavior During Therapy  [facility] for tasks assessed/performed       Past Medical History:  Diagnosis Date  . Anemia    hx of as child and early adult   . Arthritis   . Breast cancer in female [facility])    Right  . Claustrophobia   . Fibromyalgia   . H/O fibromyalgia     Past Surgical History:  Procedure Laterality Date  . BUNIONECTOMY     bil  . COLONOSCOPY    . FINGER SURGERY     right ring finger /1994  . FINGER SURGERY    . KNEE ARTHROSCOPY Right   . KNEE SURGERY     left / arthroscopic  . MASTECTOMY W/ SENTINEL NODE BIOPSY Right 04/20/2018   Procedure: RIGHT TOTAL MASTECTOMY WITH AXILLARY SENTINEL LYMPH NODE BIOPSY;  Surgeon: Excell Seltzer, MD;  Location: [place];  Service: General;  Laterality: Right;  . TOTAL KNEE ARTHROPLASTY Right 02/15/2014   Procedure: RIGHT TOTAL KNEE ARTHROPLASTY;  Surgeon: Mauri Pole, MD;  [facility];  Service: Orthopedics;  Laterality: Right;  . TOTAL KNEE ARTHROPLASTY Left 03/25/2016   Procedure: LEFT TOTAL KNEE ARTHROPLASTY;  Surgeon: Paralee Cancel, MD;  [facility];  Service: Orthopedics;  Laterality: Left;  Adductor Block    There were no vitals filed for this visit.  Subjective Assessment - 10/12/18 0806    Subjective  I have an appt with an orthopedist today so we'll see how that goes. I have a history of seeing  Rexene Agent, DPT so I might go see him after I get my diagnosis.     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  Yes    Pain Score  6     Pain Location  Shoulder    Pain Orientation  Right    Pain Descriptors / Indicators  Aching;Sharp    Pain Type  Acute pain    Pain Onset  More than a month ago    Pain Frequency  Constant    Pain Relieving Factors  always hurts, wakes me at night when I 'm sleeping    Effect of Pain on Daily Activities  TENS and PT offer temporary relief                       [facility] Adult PT Treatment/Exercise - 10/12/18 0001      Manual Therapy   Soft tissue mobilization  With thick massage lotion to Rt upper arm/bicep area where tender trigger points palpable.  Myofascial Release  across right chest in area of tightness, but also in Rt axilla where cording still palpable towards her available end P/ROM, and to lateral chest wall with end range flex and abd    Passive ROM  to Rt shoulder in direction of flexion, abduction, and D2 with myofascial release    Neural Stretch  --                  PT Long Term Goals - 09/28/18 0855      PT LONG TERM GOAL #1   Title  Pt will demonstrate 165 degrees of R shoulder flexion to allow her to return to PLOF    Baseline  156, 07/20/18- 153; 159 degrees-09/28/18    Status  On-going      PT LONG TERM GOAL #2   Title  Pt will be independent in a home exercise program for continued strengthening and stretching    Status  On-going      PT LONG TERM GOAL #3   Title  Pt will report a 50% improvement in swelling in right lateral trunk and inferior to mastectomy scar to allow improved comfort    Baseline  07/20/18- pt reports this is about the same; 60-70% improvement at this time, just feels more  tightness at chest wall now-09/28/18    Status  Achieved      PT LONG TERM GOAL #4   Title  Pt will report a 75% improvement in tightness across chest with UE movements to allow pt to return to PLOF    Baseline  Pt reports 60-70% improvement with tightness-09/28/18    Status  Partially Met      PT LONG TERM GOAL #5   Title  Pt will report a 25% improvement of pain in her Rt UE with UE movements to allow pt to return to PLOF    Baseline  80% pain throughout day-09/28/18    Time  4    Period  Weeks    Status  New      Additional Long Term Goals   Additional Long Term Goals  Yes      PT LONG TERM GOAL #6   Title  Pt will report pain no more than 4/10 after light yard work     Baseline  8-10/10 depending on activity-09/28/18    Time  4    Period  Weeks    Status  New            Plan - 10/12/18 4132    Clinical Impression Statement  Pt reports no change with her pain other than temporary relief at the end of sessions that last for a few hours. Also still presents with pain at end of P/ROM and tender to palpation at multiple areas of upper arm/anterior shoulder.  She has an appt with an orhtopedist today and will decide what to do from there. She reports she has seen Rexene Agent, DPT a few times over the years after her other surgeries (TKR x2, and back surgery) so she may go see him again for her Rt shoulder. Pt is to call and let us know diagnosis if she finds out before her next appt with [place] Thursday.    Rehab Potential  Excellent    PT Frequency  2x / week    PT Duration  4 weeks    PT Treatment/Interventions  ADLs/Self Care Home Management;Therapeutic activities;Therapeutic exercise;Patient/family education;Manual techniques;Manual lymph drainage;Compression bandaging;Scar mobilization;Passive range of motion;Taping  PT Next Visit Plan  See what orthopedist says, is pt going to [place] POC to another PT clinic as she said she might today?     Consulted and Agree with Plan of Care   Patient       Patient will benefit from skilled therapeutic intervention in order to improve the following deficits and impairments:  Increased fascial restricitons, Pain, Decreased scar mobility, Decreased range of motion, Decreased strength, Decreased knowledge of precautions, Increased edema  Visit Diagnosis: Stiffness of right shoulder, not elsewhere classified  Aftercare following surgery for neoplasm  Muscle weakness (generalized)  Localized edema     Problem List Patient Active Problem List   Diagnosis Date Noted  . Ductal carcinoma in situ (DCIS) of right breast 03/09/2018  . 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 Fibromyalgia 11/24/2011  . Lumbar degenerative disc disease 11/24/2011    Otelia Limes, PTA 10/12/2018, 8:58 AM  [place], [place], [address] Phone: [phone]   Fax:  [phone]  Name: Jodi Gutierrez MRN: [id number] Date of Birth: June 12, 1950

## 2018-10-14 ENCOUNTER — Ambulatory Visit: Payer: Medicare Other | Admitting: Rehabilitation

## 2018-10-15 ENCOUNTER — Encounter: Payer: Self-pay | Admitting: Rehabilitation

## 2018-10-15 ENCOUNTER — Other Ambulatory Visit: Payer: Self-pay

## 2018-10-15 ENCOUNTER — Ambulatory Visit: Payer: Medicare Other | Admitting: Rehabilitation

## 2018-10-15 DIAGNOSIS — M25611 Stiffness of right shoulder, not elsewhere classified: Secondary | ICD-10-CM | POA: Diagnosis not present

## 2018-10-15 DIAGNOSIS — M6281 Muscle weakness (generalized): Secondary | ICD-10-CM

## 2018-10-15 DIAGNOSIS — R600 Localized edema: Secondary | ICD-10-CM

## 2018-10-15 DIAGNOSIS — Z483 Aftercare following surgery for neoplasm: Secondary | ICD-10-CM

## 2018-10-15 NOTE — Therapy (Signed)
[place], [place], [address] Phone: [phone]   Fax:  [phone]  Physical Therapy Treatment  Patient Details  Name: Jodi Gutierrez MRN: [id number] Date of Birth: [birth year] Referring Provider (PT): Hoxworth   Encounter Date: 10/15/2018  PT End of Session - 10/15/18 1153    Visit Number  19    Number of Visits  25    Date for PT Re-Evaluation  10/26/18    PT Start Time  0254    PT Stop Time  1150    PT Time Calculation (min)  48 min    Activity Tolerance  Patient tolerated treatment well    Behavior During Therapy  [facility] for tasks assessed/performed       Past Medical History:  Diagnosis Date  . Anemia    hx of as child and early adult   . Arthritis   . Breast cancer in female [facility])    Right  . Claustrophobia   . Fibromyalgia   . H/O fibromyalgia     Past Surgical History:  Procedure Laterality Date  . BUNIONECTOMY     bil  . COLONOSCOPY    . FINGER SURGERY     right ring finger /1994  . FINGER SURGERY    . KNEE ARTHROSCOPY Right   . KNEE SURGERY     left / arthroscopic  . MASTECTOMY W/ SENTINEL NODE BIOPSY Right 04/20/2018   Procedure: RIGHT TOTAL MASTECTOMY WITH AXILLARY SENTINEL LYMPH NODE BIOPSY;  Surgeon: Excell Seltzer, MD;  Location: [place];  Service: General;  Laterality: Right;  . TOTAL KNEE ARTHROPLASTY Right 02/15/2014   Procedure: RIGHT TOTAL KNEE ARTHROPLASTY;  Surgeon: Mauri Pole, MD;  [facility];  Service: Orthopedics;  Laterality: Right;  . TOTAL KNEE ARTHROPLASTY Left 03/25/2016   Procedure: LEFT TOTAL KNEE ARTHROPLASTY;  Surgeon: Paralee Cancel, MD;  [facility];  Service: Orthopedics;  Laterality: Left;  Adductor Block    There were no vitals filed for this visit.  Subjective Assessment - 10/15/18 1105    Subjective  They took xrays at [facility] showing arthritis and possible joint fluid.  Got a cortisone injection Monday afternoon. The pain is  a little better.  No repeated motions.   (Pended)     Pertinent History  R DCIS with mastectomy SLNB on 04/20/18,no chemo or radiation  bilateral TKA (2 and 5 years ago), bunion surgery both toes, fibromyalgia, arthritis  (Pended)     Patient Stated Goals  to get back to where I was, stronger  (Pended)     Currently in Pain?  Yes  (Pended)     Pain Score  4   (Pended)     Pain Location  Arm  (Pended)     Pain Orientation  Right  (Pended)     Pain Descriptors / Indicators  Aching;Sharp  (Pended)     Pain Type  Acute pain  (Pended)     Pain Onset  More than a month ago  (Pended)     Pain Frequency  Constant  (Pended)     Aggravating Factors   most movements  (Pended)                        [facility] Adult PT Treatment/Exercise - 10/15/18 0001      Manual Therapy   Soft tissue mobilization  to the Rt chest, incision, pectoralis, latissimus in neutral and with some shoulder stretched positions  avoiding shoulder pain    Myofascial Release  to the Rt chest    Passive ROM  to tolerance before pain into flexion, abduction and ER                  PT Long Term Goals - 10/15/18 1155      PT LONG TERM GOAL #1   Title  Pt will demonstrate 165 degrees of R shoulder flexion to allow her to return to PLOF    Status  On-going      PT LONG TERM GOAL #2   Title  Pt will be independent in a home exercise program for continued strengthening and stretching    Status  On-going      PT LONG TERM GOAL #3   Title  Pt will report a 50% improvement in swelling in right lateral trunk and inferior to mastectomy scar to allow improved comfort    Status  Achieved      PT LONG TERM GOAL #4   Title  Pt will report a 75% improvement in tightness across chest with UE movements to allow pt to return to PLOF    Status  On-going      PT LONG TERM GOAL #5   Title  Pt will report a 25% improvement of pain in her Rt UE with UE movements to allow pt to return to PLOF    Status  On-going             Plan - 10/15/18 1153    Clinical Impression Statement  Pt returns after ortho visit who suspects bursitis and no tears.  Received a cortisone injection which has decreased some of the pain.  Pt was still able to tolerance MT and myofasical work to the chest with some shoulder PROM without increased pain.  Treatment today more for chest discomfort and not shoulder pain.  Tendnerness Rt latissimus, pectoralis, and near drain site.      PT Next Visit Plan  continue Rt chest myofasical work/PROM of the shoulder prior to pain and work from shoulder isometrics back to band exercises as pain allows.      Consulted and Agree with Plan of Care  Patient       Patient will benefit from skilled therapeutic intervention in order to improve the following deficits and impairments:     Visit Diagnosis: Stiffness of right shoulder, not elsewhere classified  Aftercare following surgery for neoplasm  Muscle weakness (generalized)  Localized edema     Problem List Patient Active Problem List   Diagnosis Date Noted  . Ductal carcinoma in situ (DCIS) of right breast 03/09/2018  . 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 Fibromyalgia 11/24/2011  . Lumbar degenerative disc disease 11/24/2011    Shan Levans, PT 10/15/2018, 11:56 AM  [place], [place], [address] Phone: [phone]   Fax:  [phone]  Name: Jodi Gutierrez MRN: [id number] Date of Birth: 1950-12-10

## 2018-10-20 ENCOUNTER — Encounter: Payer: 59 | Admitting: Rehabilitation

## 2018-10-24 ENCOUNTER — Other Ambulatory Visit: Payer: Self-pay | Admitting: Hematology and Oncology

## 2018-10-26 ENCOUNTER — Other Ambulatory Visit: Payer: Self-pay

## 2018-10-26 ENCOUNTER — Ambulatory Visit: Payer: Medicare Other | Attending: General Surgery

## 2018-10-26 DIAGNOSIS — Z483 Aftercare following surgery for neoplasm: Secondary | ICD-10-CM

## 2018-10-26 DIAGNOSIS — M25611 Stiffness of right shoulder, not elsewhere classified: Secondary | ICD-10-CM

## 2018-10-26 DIAGNOSIS — M6281 Muscle weakness (generalized): Secondary | ICD-10-CM | POA: Diagnosis present

## 2018-10-26 DIAGNOSIS — R600 Localized edema: Secondary | ICD-10-CM | POA: Diagnosis present

## 2018-10-26 NOTE — Patient Instructions (Addendum)
Strengthening: Resisted Flexion   Cancer Rehab [phone]    Hold tubing with right arm at side. Pull forward and up. Move shoulder through pain-free range of motion. Repeat __5-10__ times per set. Do _1-2___ sessions per day.  Strengthening: Resisted Internal Rotation    Hold tubing in right hand, elbow at side and forearm out. Rotate forearm in across body. Repeat __5-10__ times per set. Do _1-2___ sessions per day.  Strengthening: Resisted Extension    Hold tubing in right hand, arm forward. Pull arm back, elbow straight. Squeeze shoulder blade towards spine at end of motion. Repeat _5-10___ times per set. Do _1-2___ sessions per day.  Strengthening: Resisted External Rotation    Hold tubing in right hand, elbow at side and forearm across body. Rotate forearm out. If still painful, switch to isometrics until pain resolves.  Repeat _5-10___ times per set. Do __1-2__ sessions per day.

## 2018-10-26 NOTE — Therapy (Signed)
[place], [place], [address] Phone: [phone]   Fax:  [phone]  Physical Therapy Treatment Physical Therapy Progress Note  Dates of Reporting Period: 08/06/18 to 10/26/18  Objective Reports of Subjective Statement: see below  Objective Measurements: see below  Goal Update: see below  Plan: attempt DC soon with HEP  Reason Skilled Services are Required: see below    Patient Details  Name: Jodi Gutierrez MRN: [id number] Date of Birth: Aug 24, 1950 Referring Provider (PT): Hoxworth   Encounter Date: 10/26/2018  PT End of Session - 10/26/18 1159    Visit Number  20    Number of Visits  25    Date for PT Re-Evaluation  10/26/18    PT Start Time  1100    PT Stop Time  1148    PT Time Calculation (min)  48 min    Activity Tolerance  Patient tolerated treatment well    Behavior During Therapy  [facility] for tasks assessed/performed       Past Medical History:  Diagnosis Date  . Anemia    hx of as child and early adult   . Arthritis   . Breast cancer in female [facility])    Right  . Claustrophobia   . Fibromyalgia   . H/O fibromyalgia     Past Surgical History:  Procedure Laterality Date  . BUNIONECTOMY     bil  . COLONOSCOPY    . FINGER SURGERY     right ring finger /1994  . FINGER SURGERY    . KNEE ARTHROSCOPY Right   . KNEE SURGERY     left / arthroscopic  . MASTECTOMY W/ SENTINEL NODE BIOPSY Right 04/20/2018   Procedure: RIGHT TOTAL MASTECTOMY WITH AXILLARY SENTINEL LYMPH NODE BIOPSY;  Surgeon: Excell Seltzer, MD;  Location: [place];  Service: General;  Laterality: Right;  . TOTAL KNEE ARTHROPLASTY Right 02/15/2014   Procedure: RIGHT TOTAL KNEE ARTHROPLASTY;  Surgeon: Mauri Pole, MD;  [facility];  Service: Orthopedics;  Laterality: Right;  . TOTAL KNEE ARTHROPLASTY Left 03/25/2016   Procedure: LEFT TOTAL KNEE ARTHROPLASTY;  Surgeon: Paralee Cancel, MD;  Location[facility];  Service:  Orthopedics;  Laterality: Left;  Adductor Block    There were no vitals filed for this visit.  Subjective Assessment - 10/26/18 1158    Subjective  The doctor said I had just really irritated my Rt shoulder and I was given a cortisone shot and since then my pain has been trmendously improved. I mowed the yard yesterday with mostly my Lt arm so my Rt shoulder is achy today.     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  No/denies         [facility] PT Assessment - 10/26/18 0001      AROM   Right Shoulder Flexion  167 Degrees    Right Shoulder ABduction  185 Degrees    Right Shoulder Internal Rotation  73 Degrees    Right Shoulder External Rotation  90 Degrees                   [facility] Adult PT Treatment/Exercise - 10/26/18 0001      Shoulder Exercises: Standing   External Rotation  Strengthening;Right;5  reps;Theraband    Theraband Level (Shoulder External Rotation)  Level 1 (Yellow)    External Rotation Limitations  some pain noted at anterior joint capsule at end ROM so instructed pt that when doing these at home over next few days if pain persists then to switch to isometrics for this motion until pain resolves    Internal Rotation  Strengthening;Right;5 reps;Theraband    Theraband Level (Shoulder Internal Rotation)  Level 1 (Yellow)    Flexion  Strengthening;Right;5 reps;Theraband    Theraband Level (Shoulder Flexion)  Level 1 (Yellow)    Extension  Strengthening;Right;5 reps;Theraband    Theraband Level (Shoulder Extension)  Level 1 (Yellow)      Manual Therapy   Soft tissue mobilization  --    Myofascial Release  to the Rt chest and incision    Manual Lymphatic Drainage (MLD)  In Supine: Short neck, 5 diaphragmatic breaths, Rt inguinal nodes, Rt axillo-inguinal  anastomosis and focused on rt chest wall, especially inferior to incision where fibrotic    Passive ROM  In Supine into flexion and abduction with scapular depression mostly with flexion for decreased irritation at end ROM    Neural Stretch  To Rt UE with no pain today             PT Education - 10/26/18 1148    Education Details  [place] of Rt UE          PT Long Term Goals - 10/26/18 1204      PT LONG TERM GOAL #1   Title  Pt will demonstrate 165 degrees of R shoulder flexion to allow her to return to PLOF    Baseline  156, 07/20/18- 153; 159 degrees-09/28/18; 167 degrees - 10/26/18    Status  Achieved      PT LONG TERM GOAL #2   Title  Pt will be independent in a home exercise program for continued strengthening and stretching    Baseline  Pt is working towards increasing HEP she is able to do as her pain improves since cortisone shot-10/26/18    Status  Partially Met      PT LONG TERM GOAL #3   Title  Pt will report a 50% improvement in swelling in right lateral trunk and inferior to mastectomy scar to allow improved comfort    Baseline  07/20/18- pt reports this is about the same; 60-70% improvement at this time, just feels more tightness at chest wall now-09/28/18    Status  Achieved      PT LONG TERM GOAL #4   Title  Pt will report a 75% improvement in tightness across chest with UE movements to allow pt to return to PLOF    Baseline  Pt reports 60-70% improvement with tightness-09/28/18; pt reports this just still tight most times-10/26/18    Status  On-going      PT LONG TERM GOAL #5   Title  Pt will report a 25% improvement of pain in her Rt UE with UE movements to allow pt to return to PLOF    Baseline  80% pain throughout day-09/28/18; pt did not rate today but reports pain much improved since cotisone shot-10/26/18    Status  Partially Met      PT LONG TERM GOAL #6   Title  Pt will report pain no more than 4/10 after light yard work     Baseline  8-10/10 depending on  activity-09/28/18; "just schy" after yard work yesterday-10/26/18    Status  Partially Met            Plan - 10/26/18 1159    Clinical Impression Statement  Pt comes in with pain much improved since last visit. She reports has been taking it easy and not doing manya exercises except for a few stretches. Her A/ and P/ROM was improved with less pain from last time measured so progressed pt to [place] with yellow theraband today. All was good except pain with er, even with limited ROM so for now pt will cont with isometrics for this motion. She verbalized understanding all and plans to cont use of her TENS unit and ice over next few days as well as she tries to slowly increase stretching and add [place] 1x/day. She may consider 2 week break after next visit this week to try HEP and work towards potential D/C.     Rehab Potential  Excellent    PT Frequency  2x / week   pt may want to try 1x/wk    PT Duration  4 weeks    PT Treatment/Interventions  ADLs/Self Care Home Management;Therapeutic activities;Therapeutic exercise;Patient/family education;Manual techniques;Manual lymph drainage;Compression bandaging;Scar mobilization;Passive range of motion;Taping    PT Next Visit Plan  Renewal done this visit. Assess how pt is tolerating [place] with yellow theraband and er isometrics. Pt may want to return to therapy after about 2 weeks to try HEP at home for a bit working towards D/C.     Consulted and Agree with Plan of Care  Patient       Patient will benefit from skilled therapeutic intervention in order to improve the following deficits and impairments:  Increased fascial restricitons, Pain, Decreased scar mobility, Decreased range of motion, Decreased strength, Decreased knowledge of precautions, Increased edema  Visit Diagnosis: Stiffness of right shoulder, not elsewhere classified  Aftercare following surgery for neoplasm  Muscle weakness (generalized)  Localized edema     Problem  List Patient Active Problem List   Diagnosis Date Noted  . Ductal carcinoma in situ (DCIS) of right breast 03/09/2018  . 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 Fibromyalgia 11/24/2011  . Lumbar degenerative disc disease 11/24/2011    Otelia Limes, PTA 10/26/2018, 12:10 PM  [place], [place], [address] Phone: [phone]   Fax:  [phone]  Name: Jodi Gutierrez MRN: [id number] Date of Birth: 23-Oct-1950  Shan Levans, PT

## 2018-10-28 ENCOUNTER — Ambulatory Visit: Payer: Medicare Other

## 2018-10-28 ENCOUNTER — Other Ambulatory Visit: Payer: Self-pay

## 2018-10-28 DIAGNOSIS — Z483 Aftercare following surgery for neoplasm: Secondary | ICD-10-CM

## 2018-10-28 DIAGNOSIS — M6281 Muscle weakness (generalized): Secondary | ICD-10-CM

## 2018-10-28 DIAGNOSIS — R600 Localized edema: Secondary | ICD-10-CM

## 2018-10-28 DIAGNOSIS — M25611 Stiffness of right shoulder, not elsewhere classified: Secondary | ICD-10-CM | POA: Diagnosis not present

## 2018-10-28 NOTE — Patient Instructions (Signed)

## 2018-10-28 NOTE — Therapy (Signed)
[place], [place], [address] Phone: [phone]   Fax:  [phone]  Physical Therapy Treatment  Patient Details  Name: Jodi Gutierrez MRN: [id number] Date of Birth: 20-Aug-1950 Referring Provider (PT): Hoxworth   Encounter Date: 10/28/2018  PT End of Session - 10/28/18 1209    Visit Number  21    Number of Visits  25    Date for PT Re-Evaluation  11/30/18    PT Start Time  1103    PT Stop Time  1155    PT Time Calculation (min)  52 min    Activity Tolerance  Patient tolerated treatment well    Behavior During Therapy  [facility] for tasks assessed/performed       Past Medical History:  Diagnosis Date  . Anemia    hx of as child and early adult   . Arthritis   . Breast cancer in female [facility])    Right  . Claustrophobia   . Fibromyalgia   . H/O fibromyalgia     Past Surgical History:  Procedure Laterality Date  . BUNIONECTOMY     bil  . COLONOSCOPY    . FINGER SURGERY     right ring finger /1994  . FINGER SURGERY    . KNEE ARTHROSCOPY Right   . KNEE SURGERY     left / arthroscopic  . MASTECTOMY W/ SENTINEL NODE BIOPSY Right 04/20/2018   Procedure: RIGHT TOTAL MASTECTOMY WITH AXILLARY SENTINEL LYMPH NODE BIOPSY;  Surgeon: Excell Seltzer, MD;  Location: [place];  Service: General;  Laterality: Right;  . TOTAL KNEE ARTHROPLASTY Right 02/15/2014   Procedure: RIGHT TOTAL KNEE ARTHROPLASTY;  Surgeon: Mauri Pole, MD;  [facility];  Service: Orthopedics;  Laterality: Right;  . TOTAL KNEE ARTHROPLASTY Left 03/25/2016   Procedure: LEFT TOTAL KNEE ARTHROPLASTY;  Surgeon: Paralee Cancel, MD;  [facility];  Service: Orthopedics;  Laterality: Left;  Adductor Block    There were no vitals filed for this visit.  Subjective Assessment - 10/28/18 1107    Subjective  I was a little sore at my Rt chest since last visit so I just rested yesterday but did do some stretches and it feels better today.       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  No/denies                       [facility] Adult PT Treatment/Exercise - 10/28/18 0001      Shoulder Exercises: Standing   Other Standing Exercises  Briefly demonstrated 3 way raises and pt returned demo      Shoulder Exercises: Pulleys   Flexion  1 minute    ABduction  2 minutes      Shoulder Exercises: Therapy Ball   Flexion  Both;10 reps   forward lean into end of stetch   ABduction  Right;5 reps   same side lean into end of stretch     Manual Therapy   Manual Therapy  Taping    Myofascial Release  to the Rt chest and incision    Manual Lymphatic Drainage (MLD)  In Supine: Short neck, 5 diaphragmatic breaths, Rt inguinal nodes, Rt axillo-inguinal anastomosis and focused on  rt chest wall, especially inferior to incision where fibrotic    Passive ROM  In Supine into flexion and abduction with scapular depression mostly with flexion for decreased irritation at end ROM    Neural Stretch  To Rt UE with no pain today    Kinesiotex  Edema      Kinesiotix   Edema  2 finger along Rt axillo-inguinal anastomosis to inferior to mastectomy incision but also avoiding drain incision             PT Education - 10/28/18 1203    Education Details  Bil UE 3 way raises and instructed pt in safe progression of exercises with "low and slow" to limit risk of flaring her Rt shoulder pain again    Person(s) Educated  Patient    Methods  Explanation;Demonstration;Handout    Comprehension  Verbalized understanding;Returned demonstration          PT Long Term Goals - 10/26/18 1204      PT LONG TERM GOAL #1   Title  Pt will demonstrate 165 degrees of R shoulder flexion to allow her to return to PLOF    Baseline  156, 07/20/18-  153; 159 degrees-09/28/18; 167 degrees - 10/26/18    Status  Achieved      PT LONG TERM GOAL #2   Title  Pt will be independent in a home exercise program for continued strengthening and stretching    Baseline  Pt is working towards increasing HEP she is able to do as her pain improves since cortisone shot-10/26/18    Status  Partially Met      PT LONG TERM GOAL #3   Title  Pt will report a 50% improvement in swelling in right lateral trunk and inferior to mastectomy scar to allow improved comfort    Baseline  07/20/18- pt reports this is about the same; 60-70% improvement at this time, just feels more tightness at chest wall now-09/28/18    Status  Achieved      PT LONG TERM GOAL #4   Title  Pt will report a 75% improvement in tightness across chest with UE movements to allow pt to return to PLOF    Baseline  Pt reports 60-70% improvement with tightness-09/28/18; pt reports this just still tight most times-10/26/18    Status  On-going      PT LONG TERM GOAL #5   Title  Pt will report a 25% improvement of pain in her Rt UE with UE movements to allow pt to return to PLOF    Baseline  80% pain throughout day-09/28/18; pt did not rate today but reports pain much improved since cotisone shot-10/26/18    Status  Partially Met      PT LONG TERM GOAL #6   Title  Pt will report pain no more than 4/10 after light yard work     Baseline  8-10/10 depending on activity-09/28/18; "just schy" after yard work yesterday-10/26/18    Status  Partially Met            Plan - 10/28/18 1210    Clinical Impression Statement  Progressed pt today with AA/ROM exxercises which she tolerated well but did need to be remnded to do them slowly and not push into pain. Also educated her how to progress exercises slowly to decrease flare up of pain and to start with light or no weights. Pt very agreeable to this and plans to come back in 1 week after working on HEP  to see if ready for D/C soon.     Rehab Potential  Excellent    PT  Frequency  2x / week   pt to try 1x/wk   PT Duration  4 weeks    PT Treatment/Interventions  ADLs/Self Care Home Management;Therapeutic activities;Therapeutic exercise;Patient/family education;Manual techniques;Manual lymph drainage;Compression bandaging;Scar mobilization;Passive range of motion;Taping    PT Next Visit Plan  Assess how pt is tolerating [place] with yellow theraband and er isometrics and other HEP. Was kinesiotape helpful? Ready for D/C??    Consulted and Agree with Plan of Care  Patient       Patient will benefit from skilled therapeutic intervention in order to improve the following deficits and impairments:  Increased fascial restricitons, Pain, Decreased scar mobility, Decreased range of motion, Decreased strength, Decreased knowledge of precautions, Increased edema  Visit Diagnosis: Stiffness of right shoulder, not elsewhere classified  Aftercare following surgery for neoplasm  Muscle weakness (generalized)  Localized edema     Problem List Patient Active Problem List   Diagnosis Date Noted  . Ductal carcinoma in situ (DCIS) of right breast 03/09/2018  . 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 Fibromyalgia 11/24/2011  . Lumbar degenerative disc disease 11/24/2011    Otelia Limes, PTA 10/28/2018, 12:18 PM  [place], [place], [address] Phone: [phone]   Fax:  [phone]  Name: Jodi Gutierrez MRN: [id number] Date of Birth: 1950-06-15

## 2018-11-02 ENCOUNTER — Ambulatory Visit: Payer: Medicare Other

## 2018-11-09 ENCOUNTER — Encounter: Payer: Self-pay | Admitting: Rehabilitation

## 2018-11-09 ENCOUNTER — Other Ambulatory Visit: Payer: Self-pay

## 2018-11-09 ENCOUNTER — Ambulatory Visit: Payer: Medicare Other | Admitting: Rehabilitation

## 2018-11-09 DIAGNOSIS — R600 Localized edema: Secondary | ICD-10-CM

## 2018-11-09 DIAGNOSIS — Z483 Aftercare following surgery for neoplasm: Secondary | ICD-10-CM

## 2018-11-09 DIAGNOSIS — M25611 Stiffness of right shoulder, not elsewhere classified: Secondary | ICD-10-CM | POA: Diagnosis not present

## 2018-11-09 DIAGNOSIS — M6281 Muscle weakness (generalized): Secondary | ICD-10-CM

## 2018-11-09 NOTE — Therapy (Signed)
[place], [place], [address] Phone: [phone]   Fax:  [phone]  Physical Therapy Treatment  Patient Details  Name: Jodi Gutierrez MRN: [id number] Date of Birth: [birth year] Referring Provider (PT): Hoxworth   Encounter Date: 11/09/2018  PT End of Session - 11/09/18 1351    Visit Number  22    Number of Visits  25    Date for PT Re-Evaluation  11/30/18    PT Start Time  1130    PT Stop Time  1215    PT Time Calculation (min)  45 min    Activity Tolerance  Patient tolerated treatment well    Behavior During Therapy  [facility] for tasks assessed/performed       Past Medical History:  Diagnosis Date  . Anemia    hx of as child and early adult   . Arthritis   . Breast cancer in female [facility])    Right  . Claustrophobia   . Fibromyalgia   . H/O fibromyalgia     Past Surgical History:  Procedure Laterality Date  . BUNIONECTOMY     bil  . COLONOSCOPY    . FINGER SURGERY     right ring finger /1994  . FINGER SURGERY    . KNEE ARTHROSCOPY Right   . KNEE SURGERY     left / arthroscopic  . MASTECTOMY W/ SENTINEL NODE BIOPSY Right 04/20/2018   Procedure: RIGHT TOTAL MASTECTOMY WITH AXILLARY SENTINEL LYMPH NODE BIOPSY;  Surgeon: Excell Seltzer, MD;  Location: [place];  Service: General;  Laterality: Right;  . TOTAL KNEE ARTHROPLASTY Right 02/15/2014   Procedure: RIGHT TOTAL KNEE ARTHROPLASTY;  Surgeon: Mauri Pole, MD;  [facility];  Service: Orthopedics;  Laterality: Right;  . TOTAL KNEE ARTHROPLASTY Left 03/25/2016   Procedure: LEFT TOTAL KNEE ARTHROPLASTY;  Surgeon: Paralee Cancel, MD;  [facility];  Service: Orthopedics;  Laterality: Left;  Adductor Block    There were no vitals filed for this visit.  Subjective Assessment - 11/09/18 1132    Subjective  I was alternating isometrics, band exercises, and lifting exercises on different days and stretching every day.  For the last 3 days  I have only been stretching and it still hurts the pain is starting down the arm and around the shoulder. I guess i will see Dr. 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,no chemo or radiation  bilateral TKA (2 and 5 years ago), bunion surgery both toes, fibromyalgia, arthritis    Pain Score  4     Pain Location  Shoulder    Pain Orientation  Right    Pain Descriptors / Indicators  Aching    Pain Onset  More than a month ago    Pain Frequency  Constant                       [facility] Adult PT Treatment/Exercise - 11/09/18 0001      Manual Therapy   Soft tissue mobilization  to the Rt chest, incision, pectoralis, latissimus in neutral and with some shoulder stretched positions avoiding shoulder pain    Myofascial Release  to the Rt chest and incision    Manual Lymphatic Drainage (MLD)  In Supine: Short neck, 5 diaphragmatic breaths, Rt inguinal nodes, Rt axillo-inguinal anastomosis and focused on rt chest wall, especially  inferior to incision where fibrotic      Kinesiotix   Edema  2 finger along Rt axillo-inguinal anastomosis to inferior to mastectomy incision but also avoiding drain incision                  PT Long Term Goals - 10/26/18 1204      PT LONG TERM GOAL #1   Title  Pt will demonstrate 165 degrees of R shoulder flexion to allow her to return to PLOF    Baseline  156, 07/20/18- 153; 159 degrees-09/28/18; 167 degrees - 10/26/18    Status  Achieved      PT LONG TERM GOAL #2   Title  Pt will be independent in a home exercise program for continued strengthening and stretching    Baseline  Pt is working towards increasing HEP she is able to do as her pain improves since cortisone shot-10/26/18    Status  Partially Met      PT LONG TERM GOAL #3   Title  Pt will report a 50% improvement in swelling in right lateral trunk and inferior to mastectomy scar to allow improved  comfort    Baseline  07/20/18- pt reports this is about the same; 60-70% improvement at this time, just feels more tightness at chest wall now-09/28/18    Status  Achieved      PT LONG TERM GOAL #4   Title  Pt will report a 75% improvement in tightness across chest with UE movements to allow pt to return to PLOF    Baseline  Pt reports 60-70% improvement with tightness-09/28/18; pt reports this just still tight most times-10/26/18    Status  On-going      PT LONG TERM GOAL #5   Title  Pt will report a 25% improvement of pain in her Rt UE with UE movements to allow pt to return to PLOF    Baseline  80% pain throughout day-09/28/18; pt did not rate today but reports pain much improved since cotisone shot-10/26/18    Status  Partially Met      PT LONG TERM GOAL #6   Title  Pt will report pain no more than 4/10 after light yard work     Baseline  8-10/10 depending on activity-09/28/18; "just schy" after yard work yesterday-10/26/18    Status  Partially Met            Plan - 11/09/18 1351    Clinical Impression Statement  Pt reports a return of the Rt shoulder pain since last visit starting to travel again down the arm and towards the scapula.  Pt reports that she had been performing one set of exercises daily and stretches everyday.  Pain with shoulder PROM into flexion but all others were painfree and approaching full.  Today focused on scar tissue/soft tissue release, inferior incision edema/puffiness.  Pt encourged to only do stretches, isometrics, and any painfree below 90deg band exercises preferably only retraction for now    PT Next Visit Plan  how is shoulder pain? Plan for PT?       Patient will benefit from skilled therapeutic intervention in order to improve the following deficits and impairments:     Visit Diagnosis: Aftercare following surgery for neoplasm  Muscle weakness (generalized)  Stiffness of right shoulder, not elsewhere classified  Localized edema     Problem  List Patient Active Problem List   Diagnosis Date Noted  . Ductal carcinoma in situ (DCIS) of right breast  03/09/2018  . 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 Fibromyalgia 11/24/2011  . Lumbar degenerative disc disease 11/24/2011    Shan Levans, PT 11/09/2018, 1:55 PM  [place], [place], [address] Phone: [phone]   Fax:  [phone]  Name: Jodi Gutierrez MRN: [id number] Date of Birth: 06-18-1950

## 2018-11-17 ENCOUNTER — Telehealth: Payer: Self-pay | Admitting: Adult Health

## 2018-11-17 NOTE — Telephone Encounter (Signed)
I talk with patient regarding visit  °

## 2018-11-20 DIAGNOSIS — M25511 Pain in right shoulder: Secondary | ICD-10-CM | POA: Insufficient documentation

## 2018-11-25 ENCOUNTER — Ambulatory Visit: Payer: Medicare Other | Attending: General Surgery

## 2018-11-25 ENCOUNTER — Other Ambulatory Visit: Payer: Self-pay

## 2018-11-25 DIAGNOSIS — M25611 Stiffness of right shoulder, not elsewhere classified: Secondary | ICD-10-CM | POA: Insufficient documentation

## 2018-11-25 DIAGNOSIS — M6281 Muscle weakness (generalized): Secondary | ICD-10-CM | POA: Diagnosis present

## 2018-11-25 DIAGNOSIS — Z483 Aftercare following surgery for neoplasm: Secondary | ICD-10-CM | POA: Diagnosis present

## 2018-11-25 DIAGNOSIS — R600 Localized edema: Secondary | ICD-10-CM | POA: Diagnosis present

## 2018-11-25 NOTE — Therapy (Signed)
[place], [place], [address] Phone: [phone]   Fax:  [phone]  Physical Therapy Treatment  Patient Details  Name: Jodi Gutierrez MRN: [id number] Date of Birth: Jan 27, 1951 Referring Provider (PT): [place]   Encounter Date: 11/25/2018  PT End of Session - 11/25/18 0958    Visit Number  23    Number of Visits  25    Date for PT Re-Evaluation  11/30/18    PT Start Time  0903    PT Stop Time  0951    PT Time Calculation (min)  48 min    Activity Tolerance  Patient tolerated treatment well    Behavior During Therapy  [facility] for tasks assessed/performed       Past Medical History:  Diagnosis Date  . Anemia    hx of as child and early adult   . Arthritis   . Breast cancer in female [facility])    Right  . Claustrophobia   . Fibromyalgia   . H/O fibromyalgia     Past Surgical History:  Procedure Laterality Date  . BUNIONECTOMY     bil  . COLONOSCOPY    . FINGER SURGERY     right ring finger /1994  . FINGER SURGERY    . KNEE ARTHROSCOPY Right   . KNEE SURGERY     left / arthroscopic  . MASTECTOMY W/ SENTINEL NODE BIOPSY Right 04/20/2018   Procedure: RIGHT TOTAL MASTECTOMY WITH AXILLARY SENTINEL LYMPH NODE BIOPSY;  Surgeon: Excell Seltzer, MD;  Location: [place];  Service: General;  Laterality: Right;  . TOTAL KNEE ARTHROPLASTY Right 02/15/2014   Procedure: RIGHT TOTAL KNEE ARTHROPLASTY;  Surgeon: Mauri Pole, MD;  Location: [facility];  Service: Orthopedics;  Laterality: Right;  . TOTAL KNEE ARTHROPLASTY Left 03/25/2016   Procedure: LEFT TOTAL KNEE ARTHROPLASTY;  Surgeon: Paralee Cancel, MD;  Location: [facility];  Service: Orthopedics;  Laterality: Left;  Adductor Block    There were no vitals filed for this visit.  Subjective Assessment - 11/25/18 0907    Subjective  Overall my Rt shoulder is better since I've been here last bc I've been listening to my body more and doing the more gentle exericses  you've given me, like the isometrics and stretching alot more. So I still have pain at times during the day and can't sleep on the Rt side at night much but overall, it's still getting better. Mostly it's still tight and I'd like for you guys to help me with that.    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 Pt reviewed HEP she has been doing (isometrics and some strength with therabands, light weights for OH press, and bicep curls). Encouraged  pt all she was doing was very appropriate for her current status, especially bc she has learned to listen to her body and not push into pain. Discussed ways she can cont to slowly progress over next few weeks with HEP she has already been instructed in from previous visits. Pt verbalized understanding all and conts to feel encouraged by her progress thus far and in weeks to come.       Manual Therapy   Manual Therapy  Soft tissue mobilization;Myofascial release;Passive ROM;Scapular mobilization    Soft tissue mobilization  to the Rt chest, incision, pectoralis, latissimus in neutral and with some shoulder stretched positions avoiding shoulder pain    Myofascial Release  to the Rt chest and incision    Scapular Mobilization  In Lt S/L for Rt mobs into protraction and retraction with and without P/ROM of Rt UE into abduction and flexion; also trigger point release along medial border    Passive  ROM  In Supine into flexion and abduction with scapular depression mostly with flexion for decreased irritation at end ROM                  PT Long Term Goals - 10/26/18 1204      PT LONG TERM GOAL #1   Title  Pt will demonstrate 165 degrees of R shoulder flexion to allow her to return to PLOF    Baseline  156, 07/20/18- 153; 159 degrees-09/28/18; 167 degrees - 10/26/18    Status  Achieved      PT LONG TERM GOAL #2   Title  Pt will be independent in a home exercise program for continued strengthening and stretching    Baseline  Pt is working towards increasing HEP she is able to do as her pain improves since cortisone shot-10/26/18    Status  Partially Met      PT LONG TERM GOAL #3   Title  Pt will report a 50% improvement in swelling in right lateral trunk and inferior to mastectomy scar to allow improved comfort    Baseline  07/20/18- pt reports this is about the same; 60-70% improvement at this time, just feels more tightness at chest wall now-09/28/18    Status  Achieved      PT LONG TERM GOAL #4   Title  Pt will report a 75% improvement in tightness across chest with UE movements to allow pt to return to PLOF    Baseline  Pt reports 60-70% improvement with tightness-09/28/18; pt reports this just still tight most times-10/26/18    Status  On-going      PT LONG TERM GOAL #5   Title  Pt will report a 25% improvement of pain in her Rt UE with UE movements to allow pt to return to PLOF    Baseline  80% pain throughout day-09/28/18; pt did not rate today but reports pain much improved since cotisone shot-10/26/18    Status  Partially Met      PT LONG TERM GOAL #6   Title  Pt will report pain no more than 4/10 after light yard work     Baseline  8-10/10 depending on activity-09/28/18; "just schy" after yard work yesterday-10/26/18    Status  Partially Met            Plan - 11/25/18 0958    Clinical Impression Statement  Pt returns after being out of town for 2 weeks. Since seeing the  orthopedist she has been working on progressing HEP  to include isometrics 2-3x/day and focusing on more stretching not pushing into pain. Pt is feeling more confident with being independent with HEP, especially after seeing her A/ROM measurements have slightly improved with flexion (abd was WNLs last session). So focused on manual therapy today working to alleviate tightness pt still feels at end ROM and as well as tightness therapist palpated along medial scapula border. Pt would like to return for a final assess in 2 weeks as she continues to focus on independence and progressing her HEP strengthening as pain allows. Fullness at lateral trunk and inferior to incision is much better now per pt report and by palpation by therapist so did not resume kinesiotape today.    Rehab Potential  Excellent    PT Frequency  2x / week    PT Duration  4 weeks    PT Treatment/Interventions  ADLs/Self Care Home Management;Therapeutic activities;Therapeutic exercise;Patient/family education;Manual techniques;Manual lymph drainage;Compression bandaging;Scar mobilization;Passive range of motion;Taping    PT Next Visit Plan  Pt to return in 2 weeks for potentailly final assess and D/C. Review any HEP exs, answering any questions pt may have and decide if ready for D/C.    Consulted and Agree with Plan of Care  Patient       Patient will benefit from skilled therapeutic intervention in order to improve the following deficits and impairments:  Increased fascial restricitons, Pain, Decreased scar mobility, Decreased range of motion, Decreased strength, Decreased knowledge of precautions, Increased edema  Visit Diagnosis: 1. Aftercare following surgery for neoplasm   2. Muscle weakness (generalized)   3. Stiffness of right shoulder, not elsewhere classified   4. Localized edema        Problem List Patient Active Problem List   Diagnosis Date Noted  . Ductal carcinoma in situ (DCIS) of right breast 03/09/2018  .  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 Fibromyalgia 11/24/2011  . Lumbar degenerative disc disease 11/24/2011    Otelia Limes, PTA 11/25/2018, 10:08 AM  [place], [place], [address] Phone: [phone]   Fax:  [phone]  Name: Jodi Gutierrez MRN: [id number] Date of Birth: [birth year]

## 2018-12-07 ENCOUNTER — Telehealth: Payer: Self-pay | Admitting: Hematology and Oncology

## 2018-12-07 NOTE — Progress Notes (Signed)
SURVIVORSHIP VIRTUAL VISIT:  I connected with Jodi Gutierrez on 12/07/18 at  2:00 PM EDT by telephone and verified that I am speaking with the correct person using two identifiers.   I discussed the limitations, risks, security and privacy concerns of performing an evaluation and management service by telephone and the availability of in person appointments. I also discussed with the patient that there may be a patient responsible charge related to this service. The patient expressed understanding and agreed to proceed.   BRIEF ONCOLOGIC HISTORY:  Oncology History  Ductal carcinoma in situ (DCIS) of right breast  03/09/2018 Initial Diagnosis   Ductal carcinoma in situ (DCIS) of right breast   04/20/2018 Surgery   Right mastectomy: DCIS 7 cm, margins negative, 0/2 lymph nodes negative, ER 100% strong, PR 95% strong Tis N0 stage 0   05/27/2018 -  Anti-estrogen oral therapy   Tamoxifen, 5mg  daily     INTERVAL HISTORY:  Jodi Gutierrez to review her survivorship care plan detailing her treatment course for breast cancer, as well as monitoring long-term side effects of that treatment, education regarding health maintenance, screening, and overall wellness and health promotion.     Overall, Jodi Gutierrez reports feeling quite well.  Jodi Gutierrez notes that she stopped taking Tamoxifen.  She notes that with the decreased dosage she still had significant pain from myalgia. She notes that after a few weeks she started feeling better and quality of life is improved.    Jodi Gutierrez sees her PCP regularly.  She notes she underwent bone density testing at that time, and also underwent lab testing.    REVIEW OF SYSTEMS:  Review of Systems  Constitutional: Negative for appetite change, chills, fatigue, fever and unexpected weight change.  HENT:   Negative for hearing loss, lump/mass, mouth sores and sore throat.   Eyes: Negative for eye problems and icterus.  Respiratory: Negative for chest tightness, cough and shortness of  breath.   Cardiovascular: Negative for chest pain, leg swelling and palpitations.  Gastrointestinal: Negative for abdominal distention, abdominal pain, constipation, diarrhea, nausea and vomiting.  Endocrine: Negative for hot flashes.  Genitourinary: Positive for dyspareunia.   Musculoskeletal: Negative for arthralgias.  Skin: Negative for itching and rash.  Neurological: Negative for dizziness, extremity weakness, headaches and numbness.  Hematological: Negative for adenopathy. Does not bruise/bleed easily.  Psychiatric/Behavioral: Negative for depression. The patient is not nervous/anxious.   Breast: Denies any new nodularity, masses, tenderness, nipple changes, or nipple discharge.   ONCOLOGY TREATMENT TEAM:  1. Surgeon:  Dr. [facility] Seltzer at [facility] 2. Medical Oncologist: Dr. Lindi Adie  3. Radiation Oncologist: Dr. Sondra Come    PAST MEDICAL/SURGICAL HISTORY:  Past Medical History:  Diagnosis Date  . Anemia    hx of as child and early adult   . Arthritis   . Breast cancer in female [facility])    Right  . Claustrophobia   . Fibromyalgia   . H/O fibromyalgia    Past Surgical History:  Procedure Laterality Date  . BUNIONECTOMY     bil  . COLONOSCOPY    . FINGER SURGERY     right ring finger /1994  . FINGER SURGERY    . KNEE ARTHROSCOPY Right   . KNEE SURGERY     left / arthroscopic  . MASTECTOMY W/ SENTINEL NODE BIOPSY Right 04/20/2018   Procedure: RIGHT TOTAL MASTECTOMY WITH AXILLARY SENTINEL LYMPH NODE BIOPSY;  Surgeon: Excell Seltzer, MD;  Location: [place];  Service: General;  Laterality: Right;  . TOTAL KNEE  ARTHROPLASTY Right 02/15/2014   Procedure: RIGHT TOTAL KNEE ARTHROPLASTY;  Surgeon: Mauri Pole, MD;  Location[facility];  Service: Orthopedics;  Laterality: Right;  . TOTAL KNEE ARTHROPLASTY Left 03/25/2016   Procedure: LEFT TOTAL KNEE ARTHROPLASTY;  Surgeon: Paralee Cancel, MD;  Location[facility];  Service: Orthopedics;  Laterality: Left;  Adductor Block      ALLERGIES:  Allergies  Allergen Reactions  . Silver Other (See Comments)    swelling and blisters  . Tape Rash and Other (See Comments)    Redness    . Nickel     Blisters   . Penicillins     Knots under arm Has patient had a PCN reaction causing immediate rash, facial/tongue/throat swelling, SOB or lightheadedness with hypotension:No Has patient had a PCN reaction causing severe rash involving mucus membranes or skin necrosis:No Has patient had a PCN reaction that required hospitalization:No Has patient had a PCN reaction occurring within the last 10 years:No If all of the above answers are "NO", then may proceed with Cephalosporin use.      CURRENT MEDICATIONS:  Outpatient Encounter Medications as of 12/08/2018  Medication Sig  . acetaminophen (TYLENOL) 500 MG tablet Take 2 tablets (1,000 mg total) by mouth every 8 (eight) hours.  Marland Kitchen amitriptyline (ELAVIL) 25 MG tablet Take 25 mg by mouth at bedtime as needed for sleep.   . calcium carbonate (OSCAL) 1500 (600 Ca) MG TABS tablet Take 600 mg by mouth 2 (two) times daily with a meal.  . Cholecalciferol (VITAMIN D3) 5000 units TBDP Take 10,000 Units by mouth daily.   . diazepam (VALIUM) 5 MG tablet Take 5 mg by mouth at bedtime as needed for sedation.   . ferrous sulfate 325 (65 FE) MG tablet Take 1 tablet (325 mg total) by mouth 3 (three) times daily after meals.  . methocarbamol (ROBAXIN) 500 MG tablet Take 1 tablet (500 mg total) by mouth every 6 (six) hours as needed for muscle spasms.  . Multiple Vitamin (MULTIVITAMIN) tablet Take 1 tablet by mouth every morning.   . psyllium (METAMUCIL) 58.6 % packet Take 1 packet by mouth daily.  . tamoxifen (NOLVADEX) 10 MG tablet TAKE 1/2 TABLET BY MOUTH DAILY (5MG )  . traMADol (ULTRAM) 50 MG tablet Take 1 tablet (50 mg total) by mouth every 6 (six) hours as needed.  Marland Kitchen VITAMIN A PO Take 2,400 mcg by mouth daily.    Facility-Administered Encounter Medications as of 12/08/2018   Medication  . 0.9 %  sodium chloride infusion     ONCOLOGIC FAMILY HISTORY:  Family History  Problem Relation Age of Onset  . Colon polyps Brother   . Colon cancer Sister   . Esophageal cancer Neg Hx   . Rectal cancer Neg Hx   . Stomach cancer Neg Hx      GENETIC COUNSELING/TESTING: Not at this time  SOCIAL HISTORY:  Social History   Socioeconomic History  . Marital status: Married    Spouse name: Not on file  . Number of children: Not on file  . Years of education: Not on file  . Highest education level: Not on file  Occupational History  . Not on file  Social Needs  . Financial resource strain: Not on file  . Food insecurity    Worry: Not on file    Inability: Not on file  . Transportation needs    Medical: Not on file    Non-medical: Not on file  Tobacco Use  . Smoking status:  Never Smoker  . Smokeless tobacco: Never Used  Substance and Sexual Activity  . Alcohol use: Yes    Alcohol/week: 1.0 standard drinks    Types: 1 Glasses of wine per week    Comment: 5 drinks per week   . Drug use: No  . Sexual activity: Not on file  Lifestyle  . Physical activity    Days per week: Not on file    Minutes per session: Not on file  . Stress: Not on file  Relationships  . Social Herbalist on phone: Not on file    Gets together: Not on file    Attends religious service: Not on file    Active member of club or organization: Not on file    Attends meetings of clubs or organizations: Not on file    Relationship status: Not on file  . Intimate partner violence    Fear of current or ex partner: Not on file    Emotionally abused: Not on file    Physically abused: Not on file    Forced sexual activity: Not on file  Other Topics Concern  . Not on file  Social History Narrative  . Not on file     OBSERVATIONS/OBJECTIVE: Patient sounds well.  In no apparent distress.   Breathing non labored.  Speech non pressured, mood and behavior are normal    LABORATORY DATA:  None for this visit.  DIAGNOSTIC IMAGING:  None for this visit.      ASSESSMENT AND PLAN:  Ms.. Carrender is a pleasant 68 y.o. female with Stage 0 right breast DCIS, ER+/PR+, diagnosed in 02/2018, treated with mastectomy and anti-estrogen therapy with Tamoxifen beginning in 05/2018.  She presents to the Survivorship [facility] for our initial meeting and routine follow-up post-completion of treatment for breast cancer.    1. Stage 0 right breast cancer:  Ms. Algeo is continuing to recover from definitive treatment for breast cancer. She will follow-up with her medical oncologist, Dr. Lindi Adie in 07/2019 with history and physical exam per surveillance protocol.  She has decided to stop taking Tamoxifen.  This is reasonable considering her tremendous intolerance.  Her mammogram is due 01/2019; orders placed today. Today, a comprehensive survivorship care plan and treatment summary was reviewed with the patient today detailing her breast cancer diagnosis, treatment course, potential late/long-term effects of treatment, appropriate follow-up care with recommendations for the future, and patient education resources.  A copy of this summary, along with a letter will be sent to the patient's primary care provider via mail/fax/In Basket message after today's visit.    2. Dyspareunia: Recommended that she use lubrication such as coconut oil or astroglide.  I recommended she also get Replens and apply every three days.  She will call me if she continues to have issues and I will refer her to pelvic rehab.  3. Bone health:  Given Ms. Goldwasser's age/history of breast cancer *, she is at risk for bone demineralization.  She recently had bone density testing which was ok.  She isn't sure of the numbers but she doesn' thave to start bisphosphanate therapy at this point.  She is following up with Dr. Shelia Media about this.  She was given education on specific activities to promote bone health.  4. Cancer  screening:  Due to Ms. Kass's history and her age, she should receive screening for skin cancers, colon cancer, and gynecologic cancers.  The information and recommendations are listed on the patient's comprehensive care plan/treatment  summary and were reviewed in detail with the patient.    5. Health maintenance and wellness promotion: Ms. Graffius was encouraged to consume 5-7 servings of fruits and vegetables per day. We reviewed the "Nutrition [employer]" handout, as well as the handout "Take Control of Your Health and Reduce Your Cancer Risk" from the [employer].  She was also encouraged to engage in moderate to vigorous exercise for 30 minutes per day most days of the week. We discussed the [employer] fitness program, which is designed for cancer survivors to help them become more physically fit after cancer treatments.  She was instructed to limit her alcohol consumption and continue to abstain from tobacco use.     6. Support services/counseling: It is not uncommon for this period of the patient's cancer care trajectory to be one of many emotions and stressors.  We discussed how this can be increasingly difficult during the times of quarantine and social distancing due to the COVID-19 pandemic.   She was given information regarding our available services and encouraged to contact me with any questions or for help enrolling in any of our support group/programs.    Follow up instructions:    -Return to cancer center in 07/2019 for f/u with Dr. Lindi Adie  -Mammogram due in 01/2019 -Follow up with surgery per Dr. Excell Seltzer -She is welcome to return back to the Survivorship Clinic at any time; no additional follow-up needed at this time.  -Consider referral back to survivorship as a long-term survivor for continued surveillance  The patient was provided an opportunity to ask questions and all were answered. The patient agreed with the plan and demonstrated an understanding of the  instructions.   The patient was advised to call back or seek an in-person evaluation if the symptoms worsen or if the condition fails to improve as anticipated.   I provided 15 minutes of non face-to-face telephone visit time during this encounter, and > 50% was spent counseling as documented under my assessment & plan.  Scot Dock, NP

## 2018-12-08 ENCOUNTER — Inpatient Hospital Stay: Payer: Medicare Other | Attending: Adult Health | Admitting: Adult Health

## 2018-12-08 ENCOUNTER — Encounter: Payer: Self-pay | Admitting: Adult Health

## 2018-12-08 DIAGNOSIS — Z79810 Long term (current) use of selective estrogen receptor modulators (SERMs): Secondary | ICD-10-CM | POA: Diagnosis not present

## 2018-12-08 DIAGNOSIS — D0511 Intraductal carcinoma in situ of right breast: Secondary | ICD-10-CM | POA: Diagnosis not present

## 2018-12-08 DIAGNOSIS — Z9011 Acquired absence of right breast and nipple: Secondary | ICD-10-CM | POA: Diagnosis not present

## 2018-12-09 ENCOUNTER — Other Ambulatory Visit: Payer: Self-pay

## 2018-12-09 ENCOUNTER — Ambulatory Visit: Payer: Medicare Other

## 2018-12-09 DIAGNOSIS — Z483 Aftercare following surgery for neoplasm: Secondary | ICD-10-CM

## 2018-12-09 DIAGNOSIS — M25611 Stiffness of right shoulder, not elsewhere classified: Secondary | ICD-10-CM

## 2018-12-09 DIAGNOSIS — R600 Localized edema: Secondary | ICD-10-CM

## 2018-12-09 DIAGNOSIS — M6281 Muscle weakness (generalized): Secondary | ICD-10-CM

## 2018-12-09 NOTE — Therapy (Addendum)
[place], [place], [address] Phone: [phone]   Fax:  [phone]  Physical Therapy Treatment  Patient Details  Name: Jodi Gutierrez MRN: [id number] Date of Birth: May 02, 1951 Referring Provider (PT): [place]   Encounter Date: 12/09/2018  PT End of Session - 12/09/18 1008    Visit Number  24    Number of Visits  25    Date for PT Re-Evaluation  11/30/18    PT Start Time  0901    PT Stop Time  0950    PT Time Calculation (min)  49 min    Activity Tolerance  Patient tolerated treatment well    Behavior During Therapy  [facility] for tasks assessed/performed       Past Medical History:  Diagnosis Date  . Anemia    hx of as child and early adult   . Arthritis   . Breast cancer in female [facility])    Right  . Claustrophobia   . Fibromyalgia   . H/O fibromyalgia     Past Surgical History:  Procedure Laterality Date  . BUNIONECTOMY     bil  . COLONOSCOPY    . FINGER SURGERY     right ring finger /1994  . FINGER SURGERY    . KNEE ARTHROSCOPY Right   . KNEE SURGERY     left / arthroscopic  . MASTECTOMY W/ SENTINEL NODE BIOPSY Right 04/20/2018   Procedure: RIGHT TOTAL MASTECTOMY WITH AXILLARY SENTINEL LYMPH NODE BIOPSY;  Surgeon: Excell Seltzer, MD;  Location: [place];  Service: General;  Laterality: Right;  . TOTAL KNEE ARTHROPLASTY Right 02/15/2014   Procedure: RIGHT TOTAL KNEE ARTHROPLASTY;  Surgeon: Mauri Pole, MD;  Location: [facility];  Service: Orthopedics;  Laterality: Right;  . TOTAL KNEE ARTHROPLASTY Left 03/25/2016   Procedure: LEFT TOTAL KNEE ARTHROPLASTY;  Surgeon: Paralee Cancel, MD;  Location: [facility];  Service: Orthopedics;  Laterality: Left;  Adductor Block    There were no vitals filed for this visit.  Subjective Assessment - 12/09/18 0917    Subjective  I was doing really well until I hammered on Sunday and my arm has been sore since then. But I'm feeling good overall and ready to  D/C.    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  No/denies                       [facility] Adult PT Treatment/Exercise - 12/09/18 0001      Manual Therapy   Manual Therapy  Soft tissue mobilization;Myofascial release;Passive ROM;Scapular mobilization    Soft tissue mobilization  to the Rt chest, incision, pectoralis, latissimus in neutral and with some shoulder stretched positions avoiding shoulder pain    Myofascial Release  to the Rt chest and incision    Scapular Mobilization  In Lt S/L for Rt mobs into protraction and retraction with and without P/ROM of Rt UE into abduction and flexion; also trigger point release along lateral border and near posterior axilla with arm OH    Passive ROM  In Supine into flexion and abduction with scapular depression mostly with flexion for decreased irritation at end ROM  PT Long Term Goals - 12/09/18 0903      PT LONG TERM GOAL #1   Title  Pt will demonstrate 165 degrees of R shoulder flexion to allow her to return to PLOF    Baseline  156, 07/20/18- 153; 159 degrees-09/28/18; 167 degrees - 10/26/18; 165 degrees - 12/09/18    Status  Achieved      PT LONG TERM GOAL #2   Title  Pt will be independent in a home exercise program for continued strengthening and stretching    Baseline  Pt is working towards increasing HEP she is able to do as her pain improves since cortisone shot-10/26/18    Status  Achieved      PT LONG TERM GOAL #3   Title  Pt will report a 50% improvement in swelling in right lateral trunk and inferior to mastectomy scar to allow improved comfort    Baseline  07/20/18- pt reports this is about the same; 60-70% improvement at this time, just feels more tightness at chest wall now-09/28/18; 95%  reported - 12/09/18    Status  Achieved      PT LONG TERM GOAL #4   Title  Pt will report a 75% improvement in tightness across chest with UE movements to allow pt to return to PLOF    Baseline  Pt reports 60-70% improvement with tightness-09/28/18; pt reports this just still tight most times-10/26/18; 85% reported - 12/09/18    Status  Achieved      PT LONG TERM GOAL #5   Title  Pt will report a 25% improvement of pain in her Rt UE with UE movements to allow pt to return to PLOF    Baseline  80% pain throughout day-09/28/18; pt did not rate today but reports pain much improved since cotisone shot-10/26/18; 95% reported - 12/09/18    Status  Achieved      PT LONG TERM GOAL #6   Title  Pt will report pain no more than 4/10 after light yard work     Baseline  8-10/10 depending on activity-09/28/18; "just achy" after yard work yesterday-10/26/18; pt has yet to return to [place] but pain has been minimal with all ADLs-12/09/18    Status  Partially Met            Plan - 12/09/18 0901    Clinical Impression Statement  Pt returns after 2 weeks for final assess. Overall she reports feeling much improved up until Sunday when she tried to hammer soemthing. She is still sore from that, but not painfully so. Educated her that it would be best to avoid anything further with jarring motions to the shoulder for potentially a few more weeks and pt verbalized understanding this. She has met all goals, except less pain with light yardwork but only as she has not returned to such, so unable to assess. Pt is ready for D/C at this time.    Rehab Potential  Excellent    PT Frequency  2x / week    PT Duration  4 weeks    PT Treatment/Interventions  ADLs/Self Care Home Management;Therapeutic activities;Therapeutic exercise;Patient/family education;Manual techniques;Manual lymph drainage;Compression bandaging;Scar mobilization;Passive range of motion;Taping    PT Next Visit Plan  D/C this visit.    Consulted and Agree with  Plan of Care  Patient       Patient will benefit from skilled therapeutic intervention in order to improve the following deficits and impairments:  Increased fascial restricitons, Pain, Decreased scar mobility, Decreased  range of motion, Decreased strength, Decreased knowledge of precautions, Increased edema  Visit Diagnosis: 1. Aftercare following surgery for neoplasm   2. Muscle weakness (generalized)   3. Stiffness of right shoulder, not elsewhere classified   4. Localized edema        Problem List Patient Active Problem List   Diagnosis Date Noted  . Ductal carcinoma in situ (DCIS) of right breast 03/09/2018  . 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 Fibromyalgia 11/24/2011  . Lumbar degenerative disc disease 11/24/2011    Otelia Limes, PTA 12/09/2018, 10:18 AM  [facility] [place], [place], [address] Phone: [phone]   Fax:  [phone]  Name: BEVELY HACKBART MRN: [id number] Date of Birth: 07/30/1950   PHYSICAL THERAPY DISCHARGE SUMMARY  Visits from Start of Care: 24  Current functional level related to goals / functional outcomes: Ready for DC with HEP   Remaining deficits: Chronic pain   Education / Equipment: Final HEP Plan: Patient agrees to discharge.  Patient goals were partially met. Patient is being discharged due to being pleased with the current functional level.  ?????    Shan Levans, PT

## 2019-06-17 DIAGNOSIS — Z4789 Encounter for other orthopedic aftercare: Secondary | ICD-10-CM | POA: Insufficient documentation

## 2019-06-25 ENCOUNTER — Ambulatory Visit: Payer: Medicare Other

## 2019-06-30 ENCOUNTER — Ambulatory Visit: Payer: Medicare Other

## 2019-07-03 ENCOUNTER — Ambulatory Visit: Payer: Medicare Other | Attending: Internal Medicine

## 2019-07-03 DIAGNOSIS — Z23 Encounter for immunization: Secondary | ICD-10-CM | POA: Insufficient documentation

## 2019-07-03 NOTE — Progress Notes (Signed)
   Covid-19 Vaccination Clinic  Name:  Jodi Gutierrez    MRN: [id number] DOB: 1951-03-18  07/03/2019  [name] was observed post Covid-19 immunization for 15 minutes without incidence. She was provided with Vaccine Information Sheet and instruction to access the V-Safe system.   [name] was instructed to call 911 with any severe reactions post vaccine: Marland Kitchen Difficulty breathing  . Swelling of your face and throat  . A fast heartbeat  . A bad rash all over your body  . Dizziness and weakness    Immunizations Administered    Name Date Dose VIS Date Route   Pfizer COVID-19 Vaccine 07/03/2019  2:54 PM 0.3 mL 05/07/2019 Intramuscular   Manufacturer: [place]   Lot: [id number]   [place]: [id number]

## 2019-07-28 ENCOUNTER — Ambulatory Visit: Payer: Medicare Other | Attending: Internal Medicine

## 2019-07-28 DIAGNOSIS — Z23 Encounter for immunization: Secondary | ICD-10-CM | POA: Insufficient documentation

## 2019-07-28 NOTE — Progress Notes (Signed)
   Covid-19 Vaccination Clinic  Name:  Jodi Gutierrez    MRN: [id number] DOB: 11-25-1950  07/28/2019  [name] was observed post Covid-19 immunization for 15 minutes without incident. She was provided with Vaccine Information Sheet and instruction to access the V-Safe system.   [name] was instructed to call 911 with any severe reactions post vaccine: Marland Kitchen Difficulty breathing  . Swelling of face and throat  . A fast heartbeat  . A bad rash all over body  . Dizziness and weakness   Immunizations Administered    Name Date Dose VIS Date Route   Pfizer COVID-19 Vaccine 07/28/2019 10:40 AM 0.3 mL 05/07/2019 Intramuscular   Manufacturer: [place]   Lot: [id number]   [place]: [id number]

## 2019-08-03 NOTE — Progress Notes (Signed)
Patient Care Team: Deland Pretty, MD as PCP - General (Internal Medicine) Excell Seltzer, MD (Inactive) as Consulting Physician (General Surgery) Nicholas Lose, MD as Consulting Physician (Hematology and Oncology) Gery Pray, MD as Consulting Physician (Radiation Oncology)  DIAGNOSIS:    ICD-10-CM   1. Ductal carcinoma in situ (DCIS) of right breast  D05.11     SUMMARY OF ONCOLOGIC HISTORY: Oncology History  Ductal carcinoma in situ (DCIS) of right breast  03/09/2018 Initial Diagnosis   Ductal carcinoma in situ (DCIS) of right breast   04/20/2018 Surgery   Right mastectomy: DCIS 7 cm, margins negative, 0/2 lymph nodes negative, ER 100% strong, PR 95% strong Tis N0 stage 0   05/27/2018 - 08/2018 Anti-estrogen oral therapy   Tamoxifen, 5mg  daily stopped for toxicities (felt lousy)     CHIEF COMPLIANT: Follow-up of right breast DCIS  INTERVAL HISTORY: Jodi Gutierrez is a 69 y.o. with above-mentioned history of right breast DCIS treated with right mastectomy who is currently on antiestrogen therapy with tamoxifen at 5mg  daily. Mammogram on 02/23/19 showed no evidence of malignancy in the left breast. She presents to the clinic today for annual follow-up.   ALLERGIES:  is allergic to silver; tape; nickel; and penicillins.  MEDICATIONS:  Current Outpatient Medications  Medication Sig Dispense Refill  . acetaminophen (TYLENOL) 500 MG tablet Take 2 tablets (1,000 mg total) by mouth every 8 (eight) hours. 30 tablet 0  . amitriptyline (ELAVIL) 25 MG tablet Take 25 mg by mouth at bedtime as needed for sleep.     . calcium carbonate (OSCAL) 1500 (600 Ca) MG TABS tablet Take 600 mg by mouth 2 (two) times daily with a meal.    . Cholecalciferol (VITAMIN D3) 5000 units TBDP Take 10,000 Units by mouth daily.     . diazepam (VALIUM) 5 MG tablet Take 5 mg by mouth at bedtime as needed for sedation.     . Multiple Vitamin (MULTIVITAMIN) tablet Take 1 tablet by mouth every morning.     .  psyllium (METAMUCIL) 58.6 % packet Take 1 packet by mouth daily.    Marland Kitchen VITAMIN A PO Take 2,400 mcg by mouth daily.      Current Facility-Administered Medications  Medication Dose Route Frequency Provider Last Rate Last Admin  . 0.9 %  sodium chloride infusion  500 mL Intravenous Once Nandigam, Kavitha V, MD        PHYSICAL EXAMINATION: ECOG PERFORMANCE STATUS: 0 - Asymptomatic  Vitals:   08/04/19 0814  BP: (!) 143/77  Pulse: (!) 103  Resp: 18  Temp: (!) 97 F (36.1 C)  SpO2: 100%   Filed Weights   08/04/19 0814  Weight: 148 lb 1.6 oz (67.2 kg)    BREAST: No palpable masses or nodules . No palpable axillary supraclavicular or infraclavicular adenopathy no breast tenderness or nipple discharge. (exam performed in the presence of a chaperone)  LABORATORY DATA:  I have reviewed the data as listed CMP Latest Ref Rng & Units 04/16/2018 03/11/2018 10/27/2017  Glucose 70 - 99 mg/dL 110(H) 90 -  BUN 8 - 23 mg/dL 11 13 9   Creatinine 0.44 - 1.00 mg/dL 0.95 0.82 -  Sodium 135 - 145 mmol/L 139 144 -  Potassium 3.5 - 5.1 mmol/L 3.7 3.9 -  Chloride 98 - 111 mmol/L 104 104 -  CO2 22 - 32 mmol/L 28 28 -  Calcium 8.9 - 10.3 mg/dL 9.2 9.2 -  Total Protein 6.5 - 8.1 g/dL 6.8 6.9 -  Total  Bilirubin 0.3 - 1.2 mg/dL 0.5 0.2(L) -  Alkaline Phos 38 - 126 U/L 72 84 -  AST 15 - 41 U/L 22 17 -  ALT 0 - 44 U/L 17 12 -    Lab Results  Component Value Date   WBC 10.5 04/21/2018   HGB 11.7 (L) 04/21/2018   HCT 35.1 (L) 04/21/2018   MCV 93.6 04/21/2018   PLT 265 04/21/2018   NEUTROABS 3.4 03/11/2018    ASSESSMENT & PLAN:  Ductal carcinoma in situ (DCIS) of right breast 04/20/2018:Right mastectomy: DCIS 7 cm, margins negative, 0/2 lymph nodes negative, ER 100% strong, PR 95% strong Tis N0 stage 0 Recommendation: Patient could not tolerate adjuvant tamoxifen even at 5 mg dosage so it was discontinued. She has felt better after stopping tamoxifen therapy.  Breast cancer surveillance: Annual  mammograms and breast exams Mammogram on the left breast September 2020. Benign Density B.  This will be at [facility]. Breast exam 08/04/2019: Benign  Return to clinic in 1 year for follow-up.    No orders of the defined types were placed in this encounter.  The patient has a good understanding of the overall plan. she agrees with it. she will call with any problems that may develop before the next visit here.  Total time spent: 20 mins including face to face time and time spent for planning, charting and coordination of care  Nicholas Lose, MD 08/04/2019  I, Cloyde Reams Dorshimer, am acting as scribe for Dr. Nicholas Lose.  I have reviewed the above documentation for accuracy and completeness, and I agree with the above.

## 2019-08-04 ENCOUNTER — Other Ambulatory Visit: Payer: Self-pay

## 2019-08-04 ENCOUNTER — Telehealth: Payer: Self-pay | Admitting: Hematology and Oncology

## 2019-08-04 ENCOUNTER — Inpatient Hospital Stay: Payer: Medicare Other | Attending: Hematology and Oncology | Admitting: Hematology and Oncology

## 2019-08-04 DIAGNOSIS — D0511 Intraductal carcinoma in situ of right breast: Secondary | ICD-10-CM

## 2019-08-04 DIAGNOSIS — Z9011 Acquired absence of right breast and nipple: Secondary | ICD-10-CM | POA: Diagnosis not present

## 2019-08-04 DIAGNOSIS — Z86000 Personal history of in-situ neoplasm of breast: Secondary | ICD-10-CM | POA: Diagnosis present

## 2019-08-04 DIAGNOSIS — Z79899 Other long term (current) drug therapy: Secondary | ICD-10-CM | POA: Insufficient documentation

## 2019-08-04 NOTE — Assessment & Plan Note (Signed)
04/20/2018:Right mastectomy: DCIS 7 cm, margins negative, 0/2 lymph nodes negative, ER 100% strong, PR 95% strong Tis N0 stage 0 Recommendation: Adjuvant antiestrogen therapy with tamoxifen 20 mg daily x5 years (based on TAM-01 clinical trial data, patient is taking 5 mg dose)  Tamoxifen toxicities: Patient has noticed that her bowel movements are loose and she worries about diverticulitis.  She attributes this to tamoxifen therapy.  Breast cancer surveillance: Annual mammograms and breast exams Mammogram on the left breast September 2020. Benign Density B.  This will be at [facility].  Return to clinic in 1 year for follow-up.

## 2019-08-04 NOTE — Telephone Encounter (Signed)
I talk with patient regarding schedule  

## 2020-02-29 ENCOUNTER — Ambulatory Visit: Payer: Medicare Other | Attending: Internal Medicine

## 2020-02-29 DIAGNOSIS — Z23 Encounter for immunization: Secondary | ICD-10-CM

## 2020-02-29 NOTE — Progress Notes (Signed)
   Covid-19 Vaccination Clinic  Name:  Jodi Gutierrez    MRN: [id number] DOB: [birth year]  02/29/2020  Jodi Gutierrez was observed post Covid-19 immunization for 15 minutes without incident. She was provided with Vaccine Information Sheet and instruction to access the V-Safe system.   Jodi Gutierrez was instructed to call 911 with any severe reactions post vaccine: Marland Kitchen Difficulty breathing  . Swelling of face and throat  . A fast heartbeat  . A bad rash all over body  . Dizziness and weakness

## 2020-03-21 ENCOUNTER — Encounter: Payer: Self-pay | Admitting: Hematology and Oncology

## 2020-04-14 LAB — COLOGUARD: COLOGUARD: NEGATIVE

## 2020-08-02 NOTE — Progress Notes (Signed)
Patient Care Team: Deland Pretty, MD as PCP - General (Internal Medicine) Excell Seltzer, MD (Inactive) as Consulting Physician (General Surgery) Nicholas Lose, MD as Consulting Physician (Hematology and Oncology) Gery Pray, MD as Consulting Physician (Radiation Oncology)  DIAGNOSIS:    ICD-10-CM   1. Ductal carcinoma in situ (DCIS) of right breast  D05.11     SUMMARY OF ONCOLOGIC HISTORY: Oncology History  Ductal carcinoma in situ (DCIS) of right breast  03/09/2018 Initial Diagnosis   Ductal carcinoma in situ (DCIS) of right breast   04/20/2018 Surgery   Right mastectomy: DCIS 7 cm, margins negative, 0/2 lymph nodes negative, ER 100% strong, PR 95% strong Tis N0 stage 0   05/27/2018 - 08/2018 Anti-estrogen oral therapy   Tamoxifen, 5mg  daily stopped for toxicities (felt lousy)     CHIEF COMPLIANT: Follow-up of right breast DCIS  INTERVAL HISTORY: Jodi Gutierrez is a 70 y.o. with above-mentioned history of right breast DCIS treated with right mastectomy who is currently on antiestrogen therapy with tamoxifen at 5mg  daily.Mammogram on 02/24/20 showed no evidence of malignancy in the left breast. She presents to the clinictodayfor annual follow-up.   Complains of intermittent sharp pains in the right breast.  Sometimes it wakes her up.  Denies any lumps or nodules.  ALLERGIES:  is allergic to silver, tape, nickel, and penicillins.  MEDICATIONS:  Current Outpatient Medications  Medication Sig Dispense Refill  . ezetimibe (ZETIA) 10 MG tablet Take 1 tablet (10 mg total) by mouth daily.    Marland Kitchen levothyroxine (SYNTHROID) 25 MCG tablet Take 1 tablet (25 mcg total) by mouth daily before breakfast.    . acetaminophen (TYLENOL) 500 MG tablet Take 2 tablets (1,000 mg total) by mouth every 8 (eight) hours. 30 tablet 0  . amitriptyline (ELAVIL) 25 MG tablet Take 25 mg by mouth at bedtime as needed for sleep.     . calcium carbonate (OSCAL) 1500 (600 Ca) MG TABS tablet Take 600 mg by  mouth 2 (two) times daily with a meal.    . Cholecalciferol (VITAMIN D3) 5000 units TBDP Take 10,000 Units by mouth daily.     . diazepam (VALIUM) 5 MG tablet Take 5 mg by mouth at bedtime as needed for sedation.     . Multiple Vitamin (MULTIVITAMIN) tablet Take 1 tablet by mouth every morning.     . psyllium (METAMUCIL) 58.6 % packet Take 1 packet by mouth daily.    Marland Kitchen VITAMIN A PO Take 2,400 mcg by mouth daily.      Current Facility-Administered Medications  Medication Dose Route Frequency Provider Last Rate Last Admin  . 0.9 %  sodium chloride infusion  500 mL Intravenous Once Nandigam, Kavitha V, MD        PHYSICAL EXAMINATION: ECOG PERFORMANCE STATUS: 1 - Symptomatic but completely ambulatory  Vitals:   08/03/20 0845  BP: (!) 160/73  Pulse: 99  Resp: 18  Temp: (!) 97.5 F (36.4 C)  SpO2: 100%   Filed Weights   08/03/20 0845  Weight: 146 lb 11.2 oz (66.5 kg)      LABORATORY DATA:  I have reviewed the data as listed CMP Latest Ref Rng & Units 04/16/2018 03/11/2018 10/27/2017  Glucose 70 - 99 mg/dL 110(H) 90 -  BUN 8 - 23 mg/dL 11 13 9   Creatinine 0.44 - 1.00 mg/dL 0.95 0.82 -  Sodium 135 - 145 mmol/L 139 144 -  Potassium 3.5 - 5.1 mmol/L 3.7 3.9 -  Chloride 98 - 111 mmol/L 104  104 -  CO2 22 - 32 mmol/L 28 28 -  Calcium 8.9 - 10.3 mg/dL 9.2 9.2 -  Total Protein 6.5 - 8.1 g/dL 6.8 6.9 -  Total Bilirubin 0.3 - 1.2 mg/dL 0.5 0.2(L) -  Alkaline Phos 38 - 126 U/L 72 84 -  AST 15 - 41 U/L 22 17 -  ALT 0 - 44 U/L 17 12 -    Lab Results  Component Value Date   WBC 10.5 04/21/2018   HGB 11.7 (L) 04/21/2018   HCT 35.1 (L) 04/21/2018   MCV 93.6 04/21/2018   PLT 265 04/21/2018   NEUTROABS 3.4 03/11/2018    ASSESSMENT & PLAN:  Ductal carcinoma in situ (DCIS) of right breast 04/20/2018:Right mastectomy: DCIS 7 cm, margins negative, 0/2 lymph nodes negative, ER 100% strong, PR 95% strong Tis N0 stage 0  Recommendation: Patient could not tolerate adjuvant tamoxifen even  at 5 mg dosage so it was discontinued. She has felt better after stopping tamoxifen therapy.  Breast cancer surveillance: Annual mammograms and breast exams 1. Mammogram on the left breast 02/24/2020 [facility]. Benign Density B  2. Breast exam 08/03/2020: Benign  Return to clinic in1 yearfor follow-up.  No orders of the defined types were placed in this encounter.  The patient has a good understanding of the overall plan. she agrees with it. she will call with any problems that may develop before the next visit here.  Total time spent: 20 mins including face to face time and time spent for planning, charting and coordination of care  Rulon Eisenmenger, MD, MPH 08/03/2020  I, Molly Dorshimer, am acting as scribe for Dr. Nicholas Lose.  I have reviewed the above documentation for accuracy and completeness, and I agree with the above.

## 2020-08-03 ENCOUNTER — Telehealth: Payer: Self-pay | Admitting: Hematology and Oncology

## 2020-08-03 ENCOUNTER — Inpatient Hospital Stay: Payer: Medicare Other | Attending: Hematology and Oncology | Admitting: Hematology and Oncology

## 2020-08-03 ENCOUNTER — Other Ambulatory Visit: Payer: Self-pay

## 2020-08-03 DIAGNOSIS — D0511 Intraductal carcinoma in situ of right breast: Secondary | ICD-10-CM | POA: Insufficient documentation

## 2020-08-03 DIAGNOSIS — Z170 Estrogen receptor positive status [ER+]: Secondary | ICD-10-CM | POA: Diagnosis not present

## 2020-08-03 DIAGNOSIS — Z79810 Long term (current) use of selective estrogen receptor modulators (SERMs): Secondary | ICD-10-CM | POA: Insufficient documentation

## 2020-08-03 DIAGNOSIS — Z79899 Other long term (current) drug therapy: Secondary | ICD-10-CM | POA: Diagnosis not present

## 2020-08-03 DIAGNOSIS — Z9011 Acquired absence of right breast and nipple: Secondary | ICD-10-CM | POA: Insufficient documentation

## 2020-08-03 MED ORDER — EZETIMIBE 10 MG PO TABS: 10.0000 mg | ORAL_TABLET | Freq: Every day | ORAL | Status: AC

## 2020-08-03 MED ORDER — LEVOTHYROXINE SODIUM 25 MCG PO TABS: 25.0000 ug | ORAL_TABLET | Freq: Every day | ORAL | Status: DC

## 2020-08-03 NOTE — Assessment & Plan Note (Signed)
04/20/2018:Right mastectomy: DCIS 7 cm, margins negative, 0/2 lymph nodes negative, ER 100% strong, PR 95% strong Tis N0 stage 0  Recommendation: Patient could not tolerate adjuvant tamoxifen even at 5 mg dosage so it was discontinued. She has felt better after stopping tamoxifen therapy.  Breast cancer surveillance: Annual mammograms and breast exams 1. Mammogram on the left breast 02/24/2020 [facility]. Benign Density B  2. Breast exam 08/03/2020: Benign  Return to clinic in1 yearfor follow-up.

## 2020-08-03 NOTE — Telephone Encounter (Signed)
Scheduled appts per 3/10 los. Gave pt a print out of AVS.  

## 2020-11-10 ENCOUNTER — Other Ambulatory Visit: Payer: Self-pay | Admitting: Internal Medicine

## 2020-11-10 DIAGNOSIS — E7800 Pure hypercholesterolemia, unspecified: Secondary | ICD-10-CM

## 2020-11-16 ENCOUNTER — Other Ambulatory Visit: Payer: Self-pay

## 2020-11-16 ENCOUNTER — Ambulatory Visit (INDEPENDENT_AMBULATORY_CARE_PROVIDER_SITE_OTHER): Payer: Medicare Other

## 2020-11-16 ENCOUNTER — Encounter: Payer: Self-pay | Admitting: Podiatry

## 2020-11-16 ENCOUNTER — Ambulatory Visit: Payer: Medicare Other | Admitting: Podiatry

## 2020-11-16 DIAGNOSIS — E7800 Pure hypercholesterolemia, unspecified: Secondary | ICD-10-CM | POA: Insufficient documentation

## 2020-11-16 DIAGNOSIS — D709 Neutropenia, unspecified: Secondary | ICD-10-CM | POA: Insufficient documentation

## 2020-11-16 DIAGNOSIS — E559 Vitamin D deficiency, unspecified: Secondary | ICD-10-CM | POA: Insufficient documentation

## 2020-11-16 DIAGNOSIS — E785 Hyperlipidemia, unspecified: Secondary | ICD-10-CM | POA: Insufficient documentation

## 2020-11-16 DIAGNOSIS — M7752 Other enthesopathy of left foot: Secondary | ICD-10-CM

## 2020-11-16 DIAGNOSIS — M7751 Other enthesopathy of right foot: Secondary | ICD-10-CM | POA: Diagnosis not present

## 2020-11-16 DIAGNOSIS — M21622 Bunionette of left foot: Secondary | ICD-10-CM

## 2020-11-16 DIAGNOSIS — E038 Other specified hypothyroidism: Secondary | ICD-10-CM | POA: Insufficient documentation

## 2020-11-16 DIAGNOSIS — G4700 Insomnia, unspecified: Secondary | ICD-10-CM | POA: Insufficient documentation

## 2020-11-16 DIAGNOSIS — N951 Menopausal and female climacteric states: Secondary | ICD-10-CM | POA: Insufficient documentation

## 2020-11-16 DIAGNOSIS — N182 Chronic kidney disease, stage 2 (mild): Secondary | ICD-10-CM | POA: Insufficient documentation

## 2020-11-16 DIAGNOSIS — F339 Major depressive disorder, recurrent, unspecified: Secondary | ICD-10-CM | POA: Insufficient documentation

## 2020-11-16 MED ORDER — DEXAMETHASONE SODIUM PHOSPHATE 120 MG/30ML IJ SOLN
2.0000 mg | Freq: Once | INTRAMUSCULAR | Status: AC
Start: 2020-11-16 — End: 2020-11-16
  Administered 2020-11-16: 2 mg via INTRA_ARTICULAR

## 2020-11-16 NOTE — Progress Notes (Signed)
Subjective:  Patient ID: Jodi Gutierrez, female    DOB: [birth year],  MRN: [id number] HPI Chief Complaint  Patient presents with   Foot Pain    5th MPJ bilateral (L>R) - aching, redness, swelling x several months, tailor's bunion deformity   New Patient (Initial Visit)    Est pt 2    70 y.o. female presents with the above complaint.   ROS: Denies fever chills nausea vomiting muscle aches pains calf pain back pain chest pain shortness of breath.  Past Medical History:  Diagnosis Date   Anemia    hx of as child and early adult    Arthritis    Breast cancer in female [facility])    Right   Claustrophobia    Fibromyalgia    H/O fibromyalgia    Past Surgical History:  Procedure Laterality Date   BUNIONECTOMY     bil   COLONOSCOPY     FINGER SURGERY     right ring finger /1994   FINGER SURGERY     KNEE ARTHROSCOPY Right    KNEE SURGERY     left / arthroscopic   MASTECTOMY W/ SENTINEL NODE BIOPSY Right 04/20/2018   Procedure: RIGHT TOTAL MASTECTOMY WITH AXILLARY SENTINEL LYMPH NODE BIOPSY;  Surgeon: Excell Seltzer, MD;  Location: [place];  Service: General;  Laterality: Right;   TOTAL KNEE ARTHROPLASTY Right 02/15/2014   Procedure: RIGHT TOTAL KNEE ARTHROPLASTY;  Surgeon: Mauri Pole, MD;  Location: [facility];  Service: Orthopedics;  Laterality: Right;   TOTAL KNEE ARTHROPLASTY Left 03/25/2016   Procedure: LEFT TOTAL KNEE ARTHROPLASTY;  Surgeon: Paralee Cancel, MD;  Location: [facility];  Service: Orthopedics;  Laterality: Left;  Adductor Block    Current Outpatient Medications:    Glucosamine-Chondroitin (GLUCOSAMINE CHONDR COMPLEX PO), Take by mouth., Disp: , Rfl:    acetaminophen (TYLENOL) 500 MG tablet, Take 2 tablets (1,000 mg total) by mouth every 8 (eight) hours., Disp: 30 tablet, Rfl: 0   amitriptyline (ELAVIL) 25 MG tablet, Take 25 mg by mouth at bedtime as needed for sleep. , Disp: , Rfl:    calcium carbonate (OSCAL) 1500 (600 Ca) MG TABS tablet, Take 600 mg by mouth 2 (two)  times daily with a meal., Disp: , Rfl:    Cholecalciferol (VITAMIN D3) 5000 units TBDP, Take 10,000 Units by mouth daily. , Disp: , Rfl:    cyclobenzaprine (FLEXERIL) 10 MG tablet, Take 1 tablet by mouth at bedtime as needed., Disp: , Rfl:    ezetimibe (ZETIA) 10 MG tablet, Take 1 tablet (10 mg total) by mouth daily., Disp: , Rfl:    levothyroxine (SYNTHROID) 50 MCG tablet, Take 50 mcg by mouth every morning., Disp: , Rfl:    Multiple Vitamin (MULTIVITAMIN) tablet, Take 1 tablet by mouth every morning. , Disp: , Rfl:    VITAMIN A PO, Take 2,400 mcg by mouth daily. , Disp: , Rfl:   Current Facility-Administered Medications:    0.9 %  sodium chloride infusion, 500 mL, Intravenous, Once, Nandigam, Kavitha V, MD  Allergies  Allergen Reactions   Silver Other (See Comments)    swelling and blisters   Tape Rash and Other (See Comments)    Redness     Nickel     Blisters    Penicillins     Knots under arm Has patient had a PCN reaction causing immediate rash, facial/tongue/throat swelling, SOB or lightheadedness with hypotension:No Has patient had a PCN reaction causing severe rash involving mucus membranes or skin necrosis:No  Has patient had a PCN reaction that required hospitalization:No Has patient had a PCN reaction occurring within the last 10 years:No If all of the above answers are "NO", then may proceed with Cephalosporin use.    Review of Systems Objective:  There were no vitals filed for this visit.  General: Well developed, nourished, in no acute distress, alert and oriented x3   Dermatological: Skin is warm, dry and supple bilateral. Nails x 10 are well maintained; remaining integument appears unremarkable at this time. There are no open sores, no preulcerative lesions, no rash or signs of infection present.  Vascular: Dorsalis Pedis artery and Posterior Tibial artery pedal pulses are 2/4 bilateral with immedate capillary fill time. Pedal hair growth present. No  varicosities and no lower extremity edema present bilateral.   Neruologic: Grossly intact via light touch bilateral. Vibratory intact via tuning fork bilateral. Protective threshold with Semmes Wienstein monofilament intact to all pedal sites bilateral. Patellar and Achilles deep tendon reflexes 2+ bilateral. No Babinski or clonus noted bilateral.   Musculoskeletal: No gross boney pedal deformities bilateral. No pain, crepitus, or limitation noted with foot and ankle range of motion bilateral. Muscular strength 5/5 in all groups tested bilateral.  Gait: Unassisted, Nonantalgic.    Radiographs:  Radiographs taken today demonstrate rectus foot time osseously mature individual bilateral.  Capital osteotomies with single screw fixation to the first metatarsals which have gone on to heal uneventfully.  Also demonstrates mild tailor's bunion deformity left greater than right with lateral bowing of the fifth metatarsal.  There is also some soft tissue swelling on the left as well.  Otherwise no acute findings are noted.  Assessment & Plan:   Assessment: Tailor's bunion deformities with bursitis left.  Plan: Discussed appropriate shoe gear stretching exercise ice therapy sugar modifications.  Also injected the area today with 2 mg of dexamethasone local anesthetic placed a Band-Aid.  Discussed possible need for surgical intervention but really concentrated on appropriate shoe gear.  I will follow-up with her in the near future if necessary.     Chelly Dombeck T. [place], [place]

## 2020-12-04 ENCOUNTER — Ambulatory Visit
Admission: RE | Admit: 2020-12-04 | Discharge: 2020-12-04 | Disposition: A | Discharge status: Home / Self Care | Payer: No Typology Code available for payment source | Source: Ambulatory Visit | Attending: Internal Medicine | Admitting: Internal Medicine

## 2020-12-04 DIAGNOSIS — E7800 Pure hypercholesterolemia, unspecified: Secondary | ICD-10-CM

## 2020-12-13 ENCOUNTER — Other Ambulatory Visit: Payer: Self-pay | Admitting: Internal Medicine

## 2020-12-13 DIAGNOSIS — R911 Solitary pulmonary nodule: Secondary | ICD-10-CM

## 2021-01-25 ENCOUNTER — Ambulatory Visit
Admission: RE | Admit: 2021-01-25 | Discharge: 2021-01-25 | Disposition: A | Discharge status: Home / Self Care | Payer: Medicare Other | Source: Ambulatory Visit | Attending: Internal Medicine | Admitting: Internal Medicine

## 2021-01-25 ENCOUNTER — Other Ambulatory Visit: Payer: Self-pay

## 2021-01-25 DIAGNOSIS — R911 Solitary pulmonary nodule: Secondary | ICD-10-CM

## 2021-07-02 ENCOUNTER — Telehealth: Payer: Self-pay | Admitting: Hematology and Oncology

## 2021-07-02 NOTE — Telephone Encounter (Signed)
Rescheduled appointment per provider. Left message. Patient will be mailed an updated calendar.

## 2021-08-03 ENCOUNTER — Ambulatory Visit: Payer: Medicare Other | Admitting: Hematology and Oncology

## 2021-08-03 NOTE — Progress Notes (Incomplete)
? ?Patient Care Team: ?Deland Pretty, MD as PCP - General (Internal Medicine) ?Excell Seltzer, MD (Inactive) as Consulting Physician (General Surgery) ?Nicholas Lose, MD as Consulting Physician (Hematology and Oncology) ?Gery Pray, MD as Consulting Physician (Radiation Oncology) ? ?DIAGNOSIS: No diagnosis found. ? ?SUMMARY OF ONCOLOGIC HISTORY: ?Oncology History  ?Ductal carcinoma in situ (DCIS) of right breast  ?03/09/2018 Initial Diagnosis  ? Ductal carcinoma in situ (DCIS) of right breast ?  ?04/20/2018 Surgery  ? Right mastectomy: DCIS 7 cm, margins negative, 0/2 lymph nodes negative, ER 100% strong, PR 95% strong Tis N0 stage 0 ?  ?05/27/2018 - 08/2018 Anti-estrogen oral therapy  ? Tamoxifen, '5mg'$  daily stopped for toxicities (felt lousy) ?  ? ? ?CHIEF COMPLIANT: Follow-up of right breast DCIS ?  ? ?INTERVAL HISTORY: Jodi Gutierrez is a  71 y.o. with above-mentioned history of right breast DCIS treated with right mastectomy who is currently on antiestrogen therapy with tamoxifen at '5mg'$  daily. Mammogram on 02/23/19 showed no evidence of malignancy in the left breast. She presents to the clinic today for follow-up.  ? ? ?ALLERGIES:  is allergic to silver, tape, nickel, and penicillins. ? ?MEDICATIONS:  ?Current Outpatient Medications  ?Medication Sig Dispense Refill  ? acetaminophen (TYLENOL) 500 MG tablet Take 2 tablets (1,000 mg total) by mouth every 8 (eight) hours. 30 tablet 0  ? amitriptyline (ELAVIL) 25 MG tablet Take 25 mg by mouth at bedtime as needed for sleep.     ? calcium carbonate (OSCAL) 1500 (600 Ca) MG TABS tablet Take 600 mg by mouth 2 (two) times daily with a meal.    ? Cholecalciferol (VITAMIN D3) 5000 units TBDP Take 10,000 Units by mouth daily.     ? cyclobenzaprine (FLEXERIL) 10 MG tablet Take 1 tablet by mouth at bedtime as needed.    ? ezetimibe (ZETIA) 10 MG tablet Take 1 tablet (10 mg total) by mouth daily.    ? Glucosamine-Chondroitin (GLUCOSAMINE CHONDR COMPLEX PO) Take by mouth.     ? levothyroxine (SYNTHROID) 50 MCG tablet Take 50 mcg by mouth every morning.    ? Multiple Vitamin (MULTIVITAMIN) tablet Take 1 tablet by mouth every morning.     ? VITAMIN A PO Take 2,400 mcg by mouth daily.     ? ?Current Facility-Administered Medications  ?Medication Dose Route Frequency Provider Last Rate Last Admin  ? 0.9 %  sodium chloride infusion  500 mL Intravenous Once Nandigam, Venia Minks, MD      ? ? ?PHYSICAL EXAMINATION: ?ECOG PERFORMANCE STATUS: {CHL ONC ECOG DX:[id number]} ? ?There were no vitals filed for this visit. ?There were no vitals filed for this visit. ? ?BREAST:*** No palpable masses or nodules in either right or left breasts. No palpable axillary supraclavicular or infraclavicular adenopathy no breast tenderness or nipple discharge. (exam performed in the presence of a chaperone) ? ?LABORATORY DATA:  ?I have reviewed the data as listed ?CMP Latest Ref Rng & Units 04/16/2018 03/11/2018 10/27/2017  ?Glucose 70 - 99 mg/dL 110(H) 90 -  ?BUN 8 - 23 mg/dL '11 13 9  '$ ?Creatinine 0.44 - 1.00 mg/dL 0.95 0.82 -  ?Sodium 135 - 145 mmol/L 139 144 -  ?Potassium 3.5 - 5.1 mmol/L 3.7 3.9 -  ?Chloride 98 - 111 mmol/L 104 104 -  ?CO2 22 - 32 mmol/L 28 28 -  ?Calcium 8.9 - 10.3 mg/dL 9.2 9.2 -  ?Total Protein 6.5 - 8.1 g/dL 6.8 6.9 -  ?Total Bilirubin 0.3 - 1.2 mg/dL 0.5 0.2(L) -  ?  Alkaline Phos 38 - 126 U/L 72 84 -  ?AST 15 - 41 U/L 22 17 -  ?ALT 0 - 44 U/L 17 12 -  ? ? ?Lab Results  ?Component Value Date  ? WBC 10.5 04/21/2018  ? HGB 11.7 (L) 04/21/2018  ? HCT 35.1 (L) 04/21/2018  ? MCV 93.6 04/21/2018  ? PLT 265 04/21/2018  ? NEUTROABS 3.4 03/11/2018  ? ? ?ASSESSMENT & PLAN:  ?No problem-specific Assessment & Plan notes found for this encounter. ? ? ? ?No orders of the defined types were placed in this encounter. ? ?The patient has a good understanding of the overall plan. she agrees with it. she will call with any problems that may develop before the next visit here. ?Total time spent: 30 mins including  face to face time and time spent for planning, charting and co-ordination of care ? ? Suzzette Righter, CMA ?08/03/21 ? ?I,Enrigue Hashimi Lannie Fields, am acting as a scribe for Dr. Lindi Adie  ?  ?

## 2021-08-07 ENCOUNTER — Inpatient Hospital Stay: Payer: Medicare Other | Attending: Hematology and Oncology | Admitting: Hematology and Oncology

## 2021-08-07 NOTE — Assessment & Plan Note (Deleted)
04/20/2018:?Right mastectomy: DCIS 7 cm, margins negative, 0/2 lymph nodes negative, ER 100% strong, PR 95% strong Tis N0 stage 0 ?? ?Recommendation:?Patient could not tolerate adjuvant tamoxifen even at 5 mg dosage so it was discontinued. ?She has felt better after stopping tamoxifen therapy. ?? ?Breast cancer surveillance: Annual mammograms and breast exams ?1. Mammogram on the left breast 03/01/21 [facility].?Benign Density B  ?2. Breast exam 08/07/2021: Benign ?? ?Return to clinic in?1 year?for follow-up. ?

## 2022-04-20 IMAGING — CT CT CARDIAC CORONARY ARTERY CALCIUM SCORE
3 series · 14 of 20 positions shown, 16 images · non-contrast
Comparison: None

CLINICAL DATA: 70-year-old white female with hypercholesterolemia.

EXAM:
CT CARDIAC CORONARY ARTERY CALCIUM SCORE
TECHNIQUE: Non-contrast imaging through the heart was performed using
prospective ECG gating. Image post processing was performed on an
independent workstation, allowing for quantitative analysis of the
heart and coronary arteries. Note that this exam targets the heart
and the chest was not imaged in its entirety.

[Series 2: calcium scoring 2.00 qr36 bestdiast 73% hrt calciu · axial · 0.30mm/px · z∈[+1747,+1837]mm · 4 of 76 slices shown]
[im 16/76  vessel]
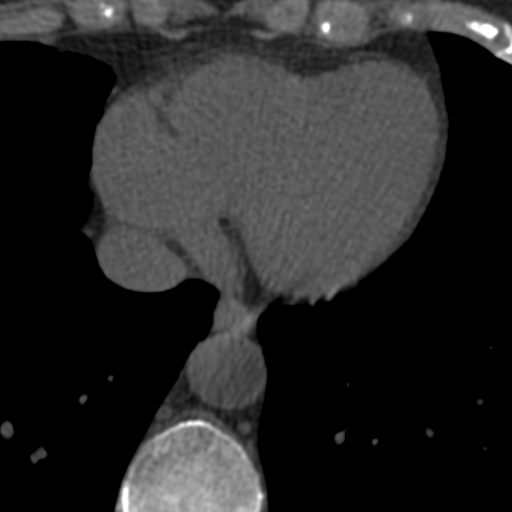
[im 31/76  vessel]
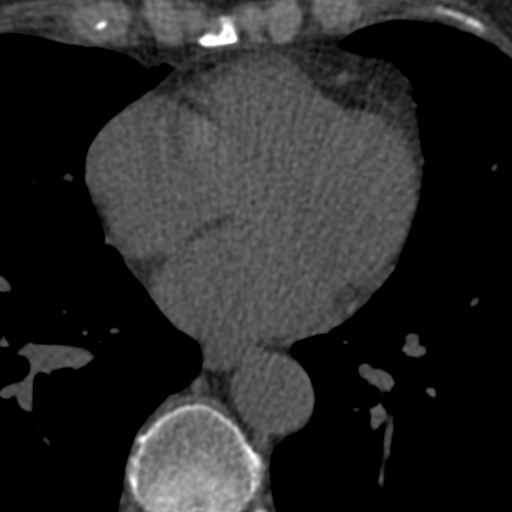
[im 46/76  vessel]
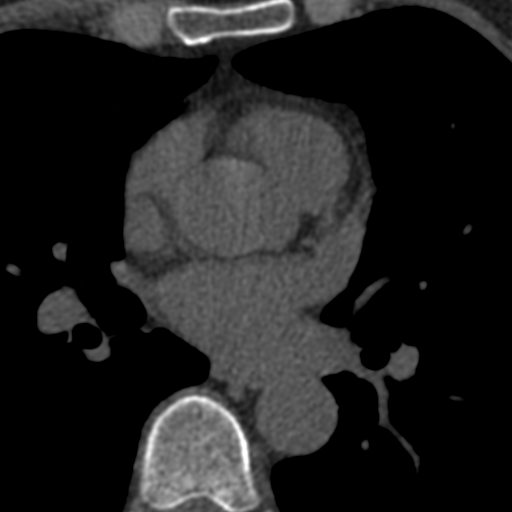
[im 61/76  vessel]
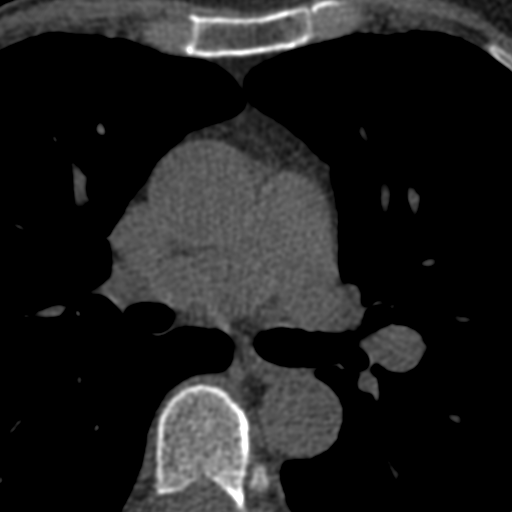

[Series 3: calcium scoring 2.00 br40 bestdiast 73% axial · axial · 0.51mm/px · z∈[+1741,+1841]mm · 5 of 76 slices shown, 7 images]
[im 13/76  vessel]
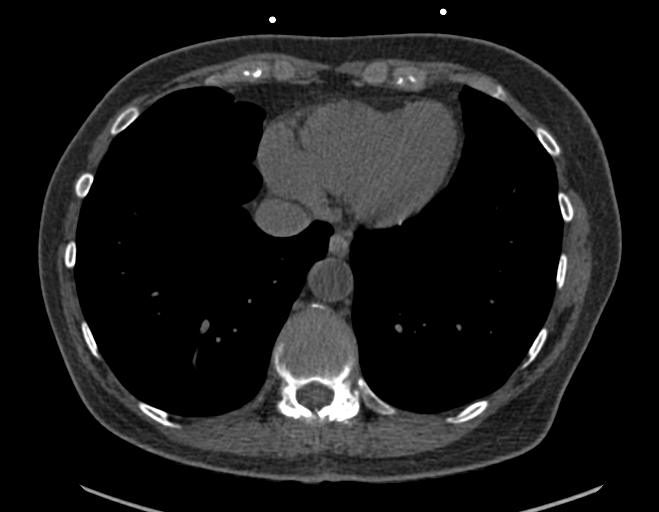
[im 13/76  lung]
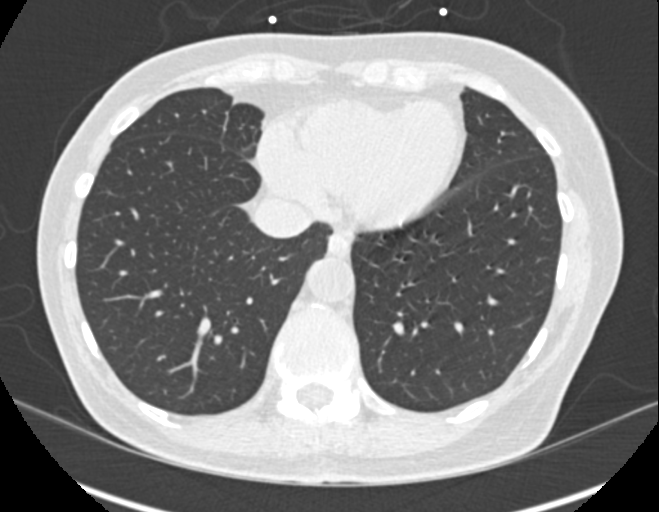
[im 26/76  vessel]
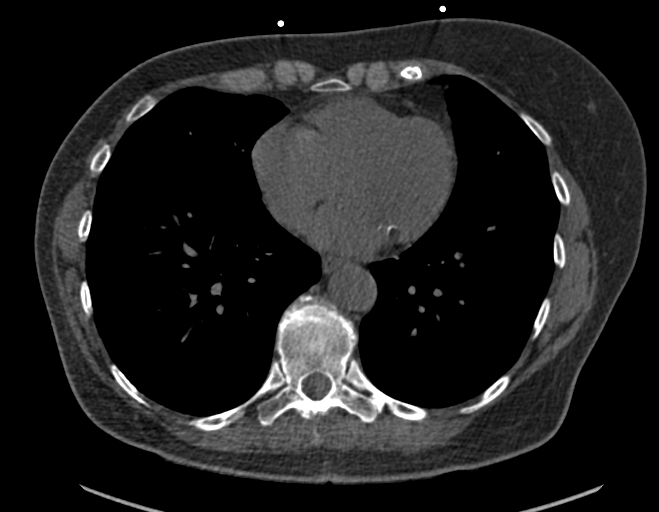
[im 38/76  vessel]
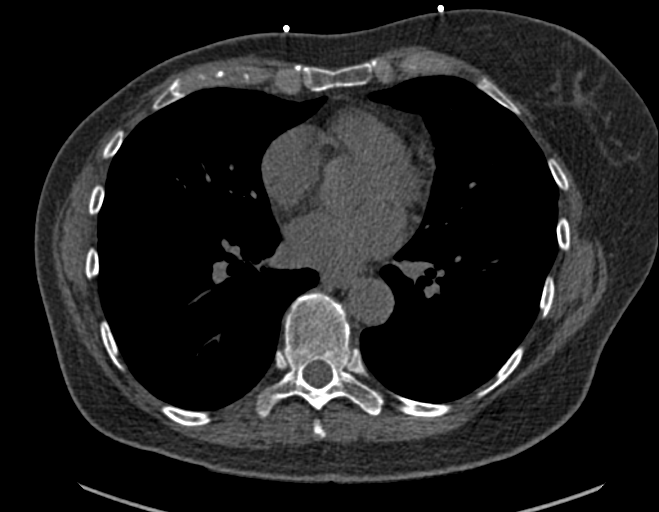
[im 51/76  vessel]
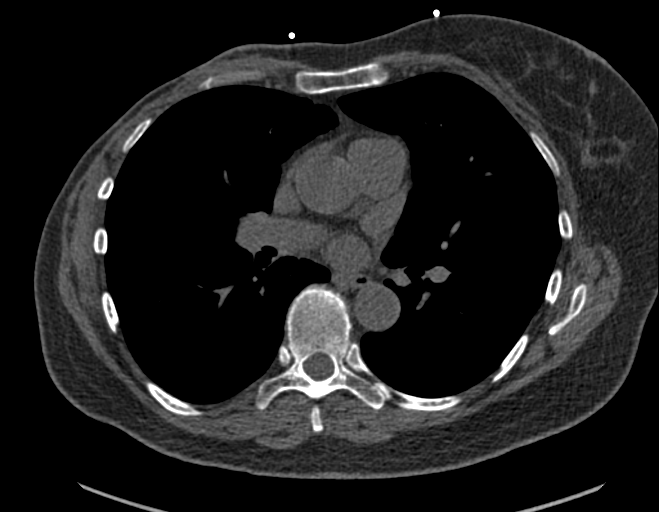
[im 63/76  vessel]
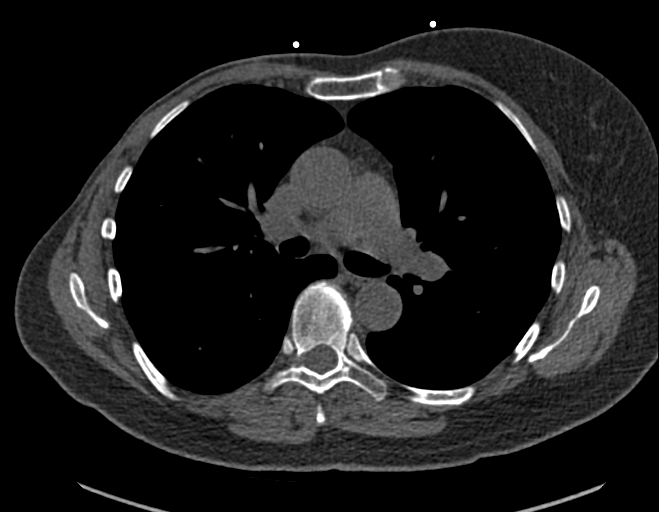
[im 63/76  lung]
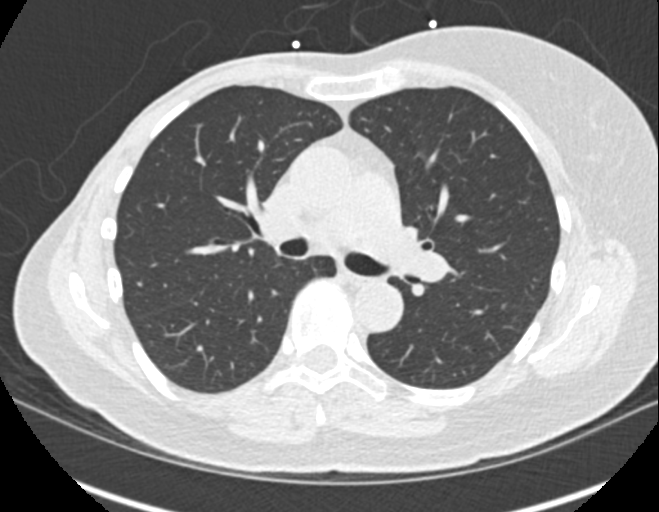

[Series 9: calcium scoring 2.00 br60 bestdiast 73% lungs · axial · 0.51mm/px · z∈[+1741,+1841]mm · 5 of 76 slices shown]
[im 13/76  vessel]
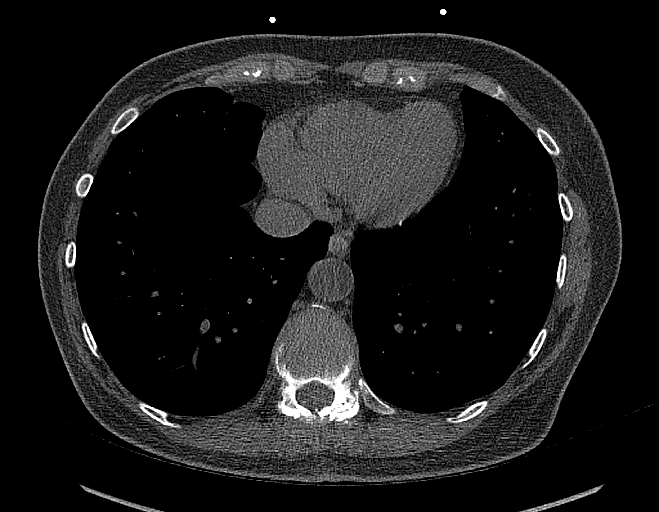
[im 26/76  vessel]
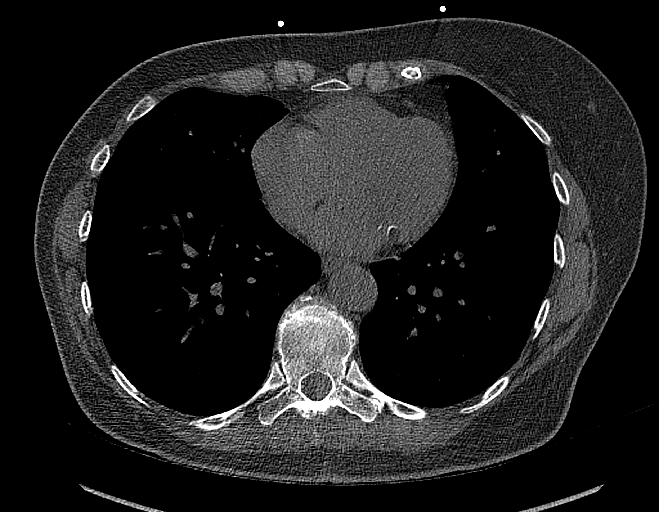
[im 38/76  vessel]
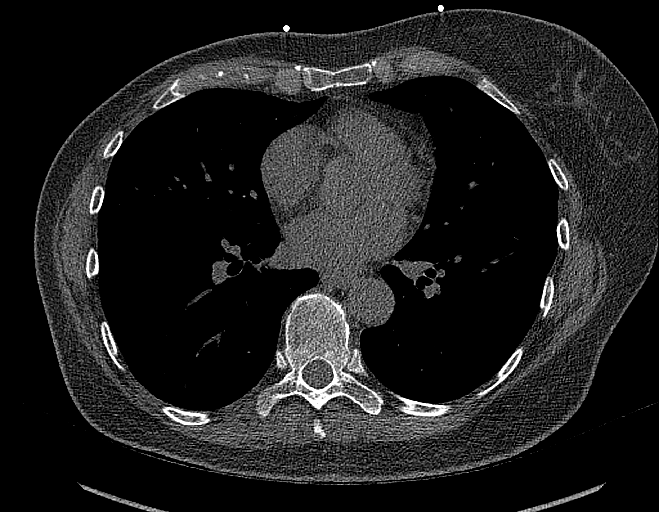
[im 51/76  vessel]
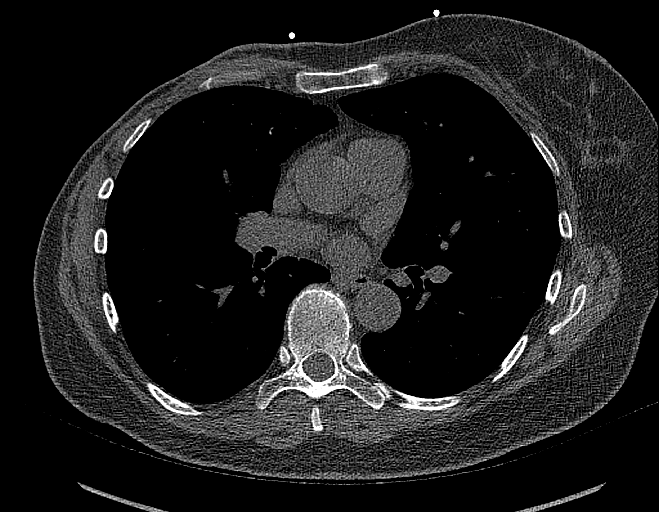
[im 63/76  vessel]
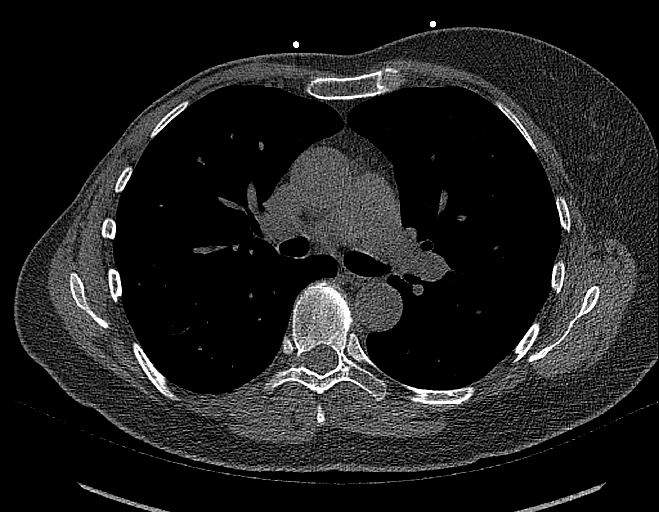

[14 of 20 positions shown; findings below may reference images not displayed]

FINDINGS: CORONARY CALCIUM SCORES:

Left Main: 0

LAD: 0

LCx: 0

RCA: 0

Total Agatston Score: 0

[HOSPITAL] percentile: 0

AORTA MEASUREMENTS:

Ascending Aorta: 31 mm

Descending Aorta: 23 mm

OTHER FINDINGS:

Cardiovascular: Mitral annular and aortic calcifications. Heart size
normal without pericardial effusion.

Central pulmonary vasculature of normal caliber. Limited assessment
of cardiovascular structures given lack of intravenous contrast.

Mediastinum/Nodes: No adenopathy in the visualized chest. Esophagus,
visualized portions grossly normal.

Lungs/Pleura: Tiny pulmonary nodule in the RIGHT lung base (image
76/9) 2.2 cm incompletely visualized. Airways are patent. No
consolidation within the visible chest.

Upper Abdomen: No upper abdominal abnormality on very limited
assessment.

Musculoskeletal: RIGHT mastectomy. No visible chest wall lesion. No
acute bone finding or destructive bone process.
IMPRESSION: 1. Coronary artery calcium score of 0. No calcified coronary artery
disease.
2. Mitral annular calcification and scattered aortic calcifications.
3. Tiny pulmonary nodule in the RIGHT lung base incompletely
visualized. Consider dedicated chest follow-up in 3 months in this
patient with history of breast cancer or comparison with previous
imaging.
4. Aortic atherosclerosis.

Aortic Atherosclerosis ([id number]).

## 2022-04-21 NOTE — Progress Notes (Signed)
Patient Care Team: Deland Pretty, MD as PCP - General (Internal Medicine) Excell Seltzer, MD (Inactive) as Consulting Physician (General Surgery) Nicholas Lose, MD as Consulting Physician (Hematology and Oncology) Gery Pray, MD as Consulting Physician (Radiation Oncology)  DIAGNOSIS:  Encounter Diagnosis  Name Primary?   Ductal carcinoma in situ (DCIS) of right breast Yes    SUMMARY OF ONCOLOGIC HISTORY: Oncology History  Ductal carcinoma in situ (DCIS) of right breast  03/09/2018 Initial Diagnosis   Ductal carcinoma in situ (DCIS) of right breast   04/20/2018 Surgery   Right mastectomy: DCIS 7 cm, margins negative, 0/2 lymph nodes negative, ER 100% strong, PR 95% strong Tis N0 stage 0   05/27/2018 - 08/2018 Anti-estrogen oral therapy   Tamoxifen, '5mg'$  daily stopped for toxicities (felt lousy)     CHIEF COMPLIANT: Follow-up Dcis surveillance  INTERVAL HISTORY: Jodi Gutierrez is a 71 y.o female with DCIS. Currently on surveillance. She presents to the clinic for a follow-up. She states that she has been doing good. She denies any pain or discomfort in breast.     ALLERGIES:  is allergic to silver, tape, nickel, and penicillins.  MEDICATIONS:  Current Outpatient Medications  Medication Sig Dispense Refill   acetaminophen (TYLENOL) 500 MG tablet Take 2 tablets (1,000 mg total) by mouth every 8 (eight) hours. 30 tablet 0   amitriptyline (ELAVIL) 25 MG tablet Take 25 mg by mouth at bedtime as needed for sleep.      calcium carbonate (OSCAL) 1500 (600 Ca) MG TABS tablet Take 600 mg by mouth 2 (two) times daily with a meal.     Cholecalciferol (VITAMIN D3) 5000 units TBDP Take 10,000 Units by mouth daily.      cyclobenzaprine (FLEXERIL) 10 MG tablet Take 1 tablet by mouth at bedtime as needed.     ezetimibe (ZETIA) 10 MG tablet Take 1 tablet (10 mg total) by mouth daily.     Glucosamine-Chondroitin (GLUCOSAMINE CHONDR COMPLEX PO) Take by mouth.     levothyroxine  (SYNTHROID) 50 MCG tablet Take 50 mcg by mouth every morning.     Multiple Vitamin (MULTIVITAMIN) tablet Take 1 tablet by mouth every morning.      VITAMIN A PO Take 2,400 mcg by mouth daily.      Current Facility-Administered Medications  Medication Dose Route Frequency Provider Last Rate Last Admin   0.9 %  sodium chloride infusion  500 mL Intravenous Once Nandigam, Kavitha V, MD        PHYSICAL EXAMINATION: ECOG PERFORMANCE STATUS: 1 - Symptomatic but completely ambulatory  Vitals:   04/24/22 0926  BP: (!) 182/90  Pulse: 99  Resp: 18  Temp: (!) 97.2 F (36.2 C)  SpO2: 100%   Filed Weights   04/24/22 0926  Weight: 146 lb 9.6 oz (66.5 kg)    BREAST: No palpable lumps or nodules in the right chest wall.  No lumps or nodules in the left breast or axilla (exam performed in the presence of a chaperone)  LABORATORY DATA:  I have reviewed the data as listed    Latest Ref Rng & Units 04/16/2018    8:28 AM 03/11/2018    8:08 AM 10/27/2017    3:44 PM  CMP  Glucose 70 - 99 mg/dL 110  90    BUN 8 - 23 mg/dL '11  13  9   '$ Creatinine 0.44 - 1.00 mg/dL 0.95  0.82    Sodium 135 - 145 mmol/L 139  144    Potassium  3.5 - 5.1 mmol/L 3.7  3.9    Chloride 98 - 111 mmol/L 104  104    CO2 22 - 32 mmol/L 28  28    Calcium 8.9 - 10.3 mg/dL 9.2  9.2    Total Protein 6.5 - 8.1 g/dL 6.8  6.9    Total Bilirubin 0.3 - 1.2 mg/dL 0.5  0.2    Alkaline Phos 38 - 126 U/L 72  84    AST 15 - 41 U/L 22  17    ALT 0 - 44 U/L 17  12      Lab Results  Component Value Date   WBC 10.5 04/21/2018   HGB 11.7 (L) 04/21/2018   HCT 35.1 (L) 04/21/2018   MCV 93.6 04/21/2018   PLT 265 04/21/2018   NEUTROABS 3.4 03/11/2018    ASSESSMENT & PLAN:  Ductal carcinoma in situ (DCIS) of right breast 04/20/2018: Right mastectomy: DCIS 7 cm, margins negative, 0/2 lymph nodes negative, ER 100% strong, PR 95% strong Tis N0 stage 0   Recommendation: Patient could not tolerate adjuvant tamoxifen even at 5 mg dosage so  it was discontinued. She has felt better after stopping tamoxifen therapy.   Breast cancer surveillance: Annual mammograms and breast exams 1. Mammogram on the left breast 03/05/2022 [facility]. Benign Density B  2. Breast exam 04/24/2022: Benign   Return to clinic in 1 year for follow-up and after that she could be graduated.    No orders of the defined types were placed in this encounter.  The patient has a good understanding of the overall plan. she agrees with it. she will call with any problems that may develop before the next visit here. Total time spent: 30 mins including face to face time and time spent for planning, charting and co-ordination of care   Harriette Ohara, MD 04/24/22    I Gardiner Coins am scribing for Dr. Lindi Adie  I have reviewed the above documentation for accuracy and completeness, and I agree with the above.

## 2022-04-24 ENCOUNTER — Inpatient Hospital Stay: Payer: Medicare Other | Attending: Hematology and Oncology | Admitting: Hematology and Oncology

## 2022-04-24 ENCOUNTER — Other Ambulatory Visit: Payer: Self-pay

## 2022-04-24 ENCOUNTER — Encounter: Payer: Self-pay | Admitting: Hematology and Oncology

## 2022-04-24 VITALS — BP 182/90 | HR 99 | Temp 97.2°F | Resp 18 | Ht 70.0 in | Wt 146.6 lb

## 2022-04-24 DIAGNOSIS — Z9011 Acquired absence of right breast and nipple: Secondary | ICD-10-CM | POA: Diagnosis not present

## 2022-04-24 DIAGNOSIS — D0511 Intraductal carcinoma in situ of right breast: Secondary | ICD-10-CM | POA: Diagnosis present

## 2022-04-24 NOTE — Assessment & Plan Note (Addendum)
04/20/2018: Right mastectomy: DCIS 7 cm, margins negative, 0/2 lymph nodes negative, ER 100% strong, PR 95% strong Tis N0 stage 0   Recommendation: Patient could not tolerate adjuvant tamoxifen even at 5 mg dosage so it was discontinued. She has felt better after stopping tamoxifen therapy.   Breast cancer surveillance: Annual mammograms and breast exams 1. Mammogram on the left breast 03/05/2022 [facility]. Benign Density B  2. Breast exam 04/24/2022: Benign   Return to clinic in 1 year for follow-up and after that she could be graduated.

## 2022-06-11 IMAGING — CT CT CHEST W/O CM
2 of 4 series · 13 of 36 positions shown, 16 images · non-contrast
Comparison: 11/04/2020

CLINICAL DATA: 70-year-old female with pulmonary nodule observed on
recent cardiac CT. Follow-up study.

EXAM:
CT CHEST WITHOUT CONTRAST
TECHNIQUE: Multidetector CT imaging of the chest was performed following the
standard protocol without IV contrast.

[Series 2: chest 2.00 br40 s3 · axial · 0.57mm/px · z∈[+1628,+1906]mm · 10 of 165 slices shown, 13 images (1 of 2)]
[im 13/165  mediastinal]
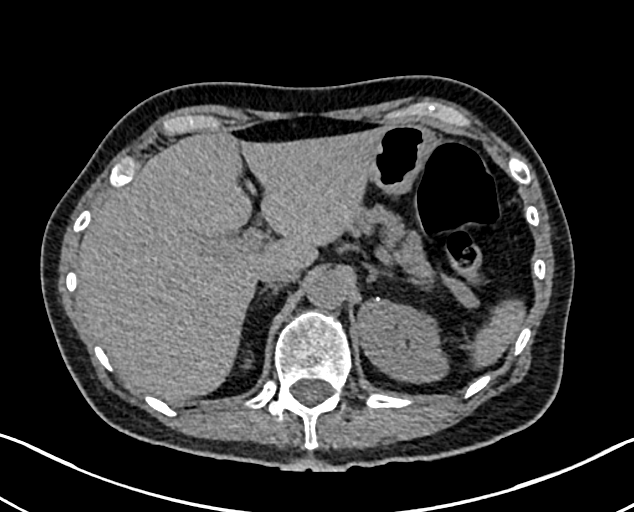
[im 13/165  lung]
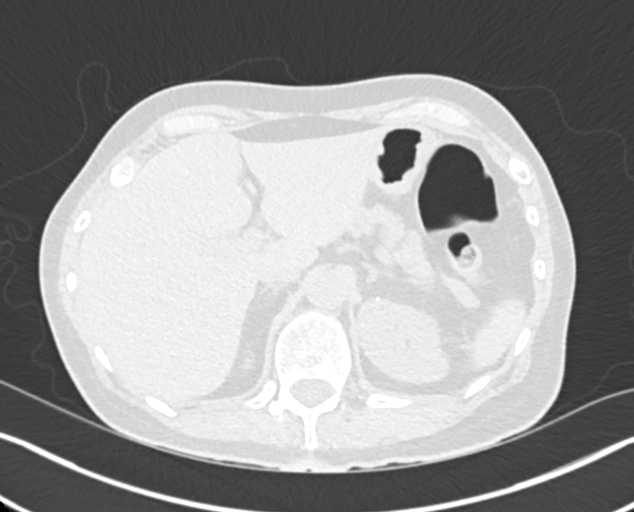
[im 26/165  lung]
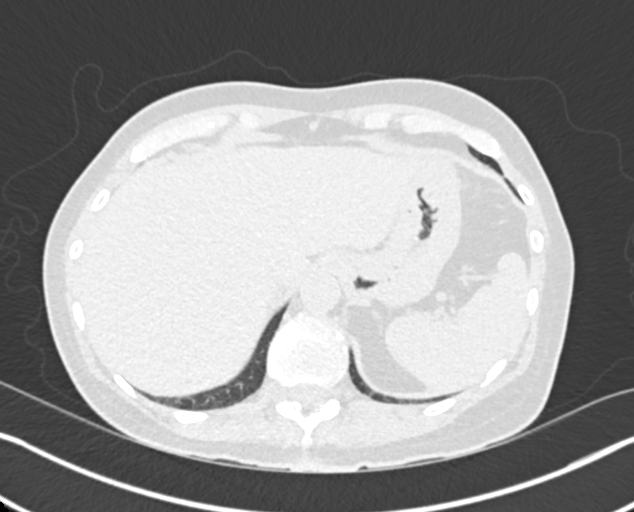
[im 51/165  lung]
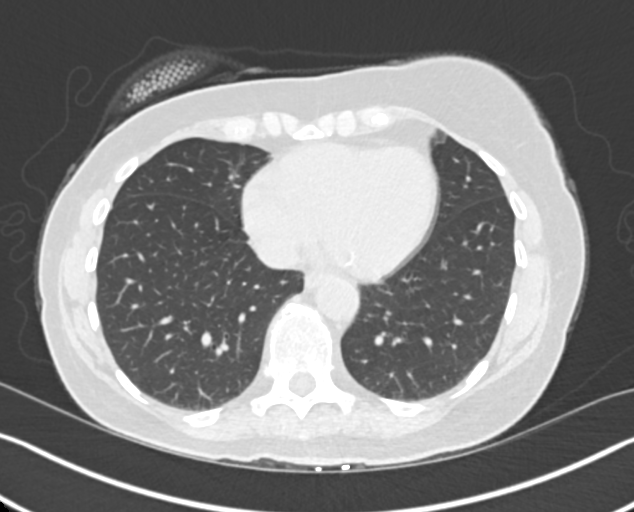
[im 64/165  lung]
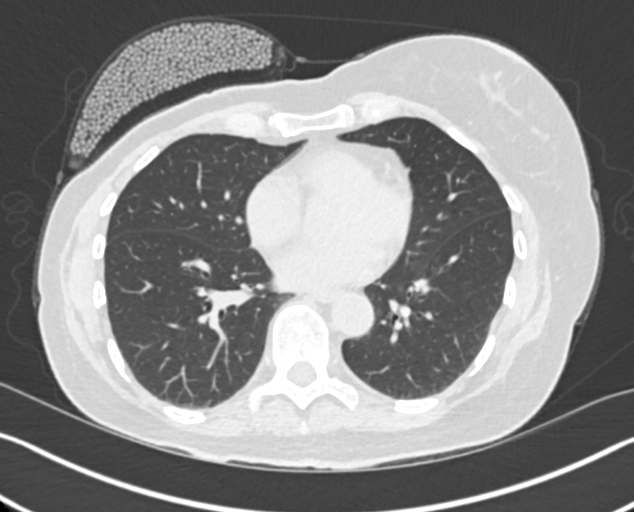
[im 76/165  mediastinal]
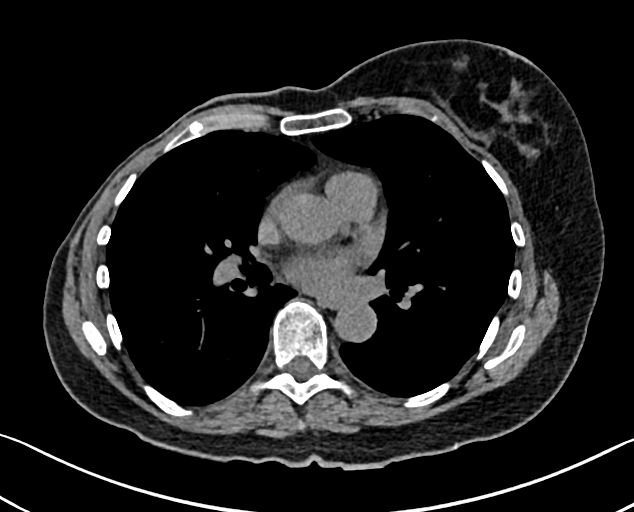
[im 76/165  lung]
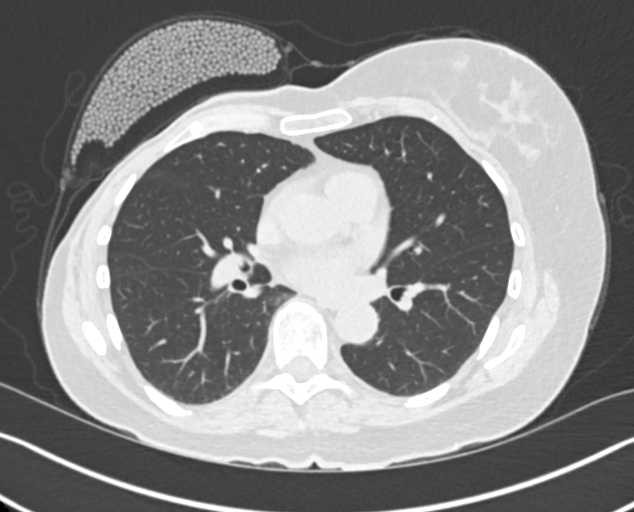
[im 89/165  lung]
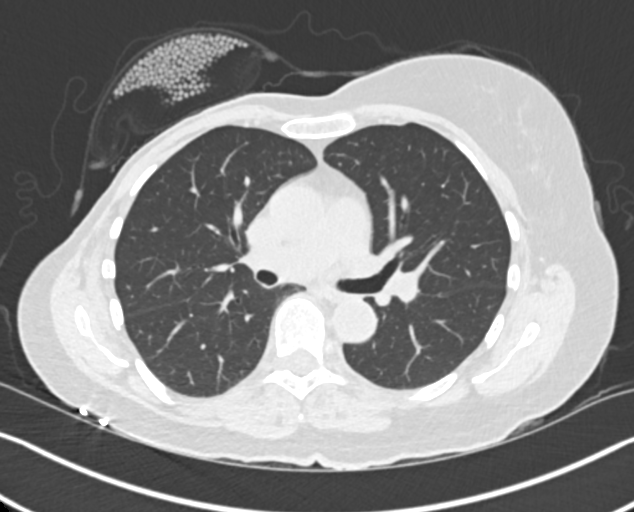
[im 101/165  lung]
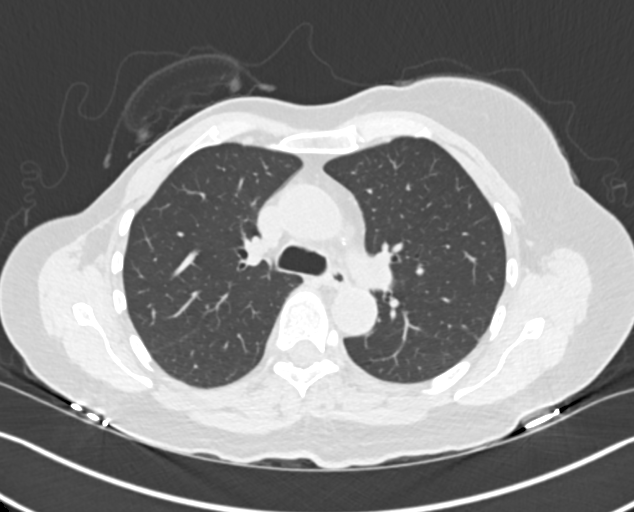
[im 127/165  lung]
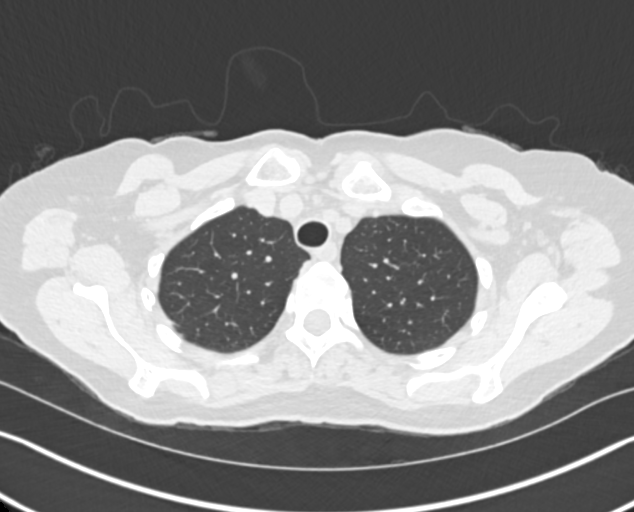
[im 139/165  mediastinal]
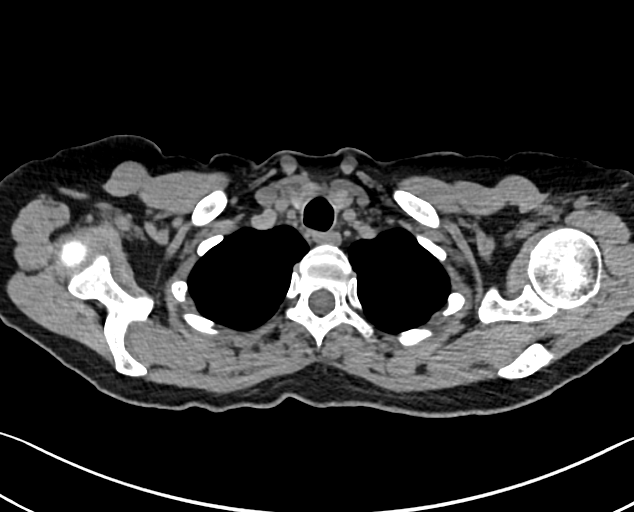
[im 139/165  lung]
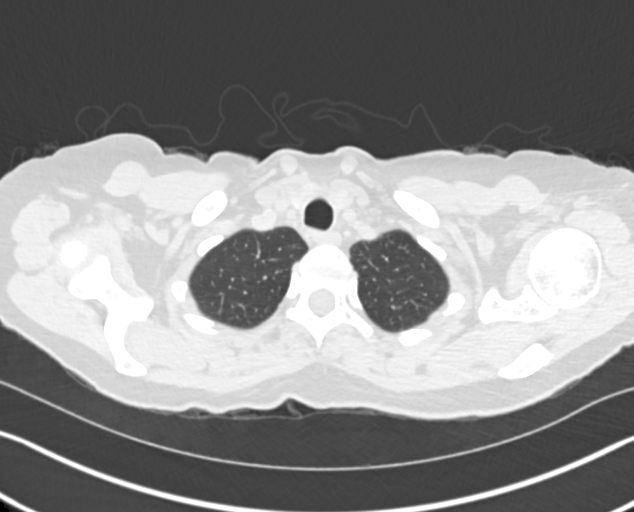
[im 152/165  lung]
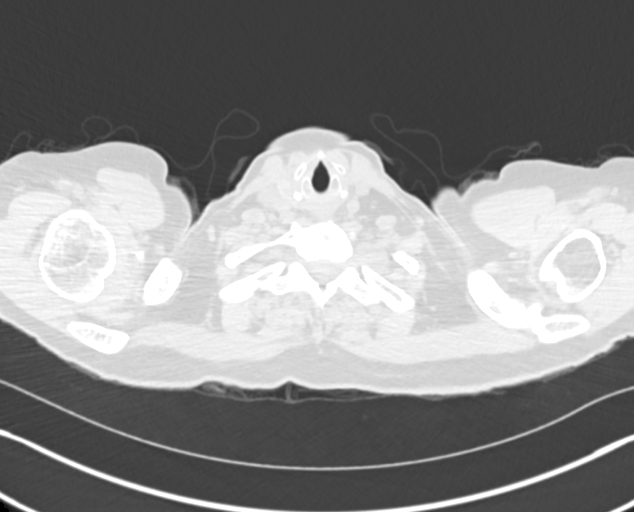

[Series 4: chest 2.00 br40 s3 · coronal · 0.65mm/px · 3 of 143 slices shown (2 of 2)]
[im 29/143  lung]
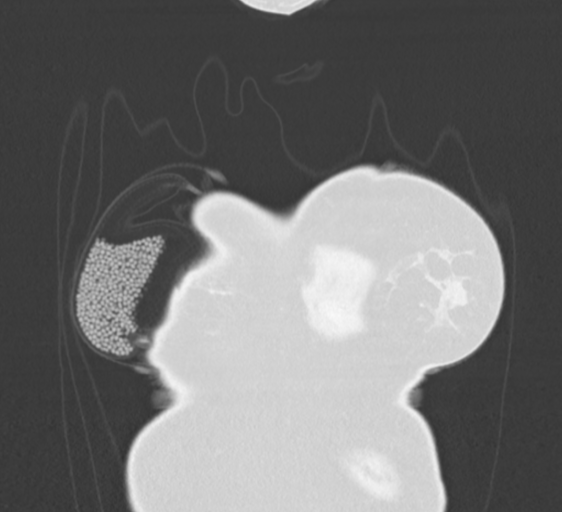
[im 57/143  lung]
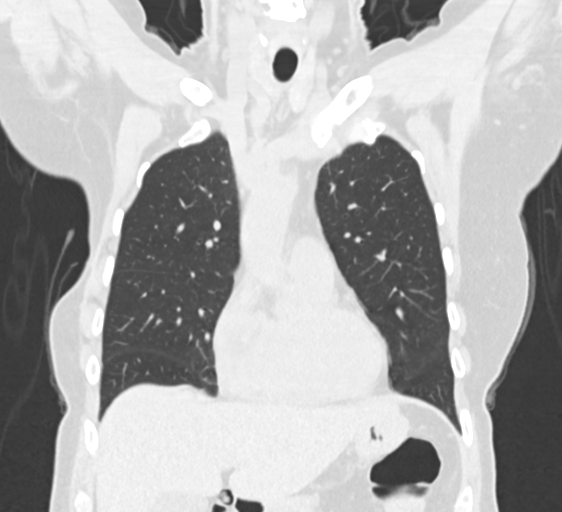
[im 86/143  lung]
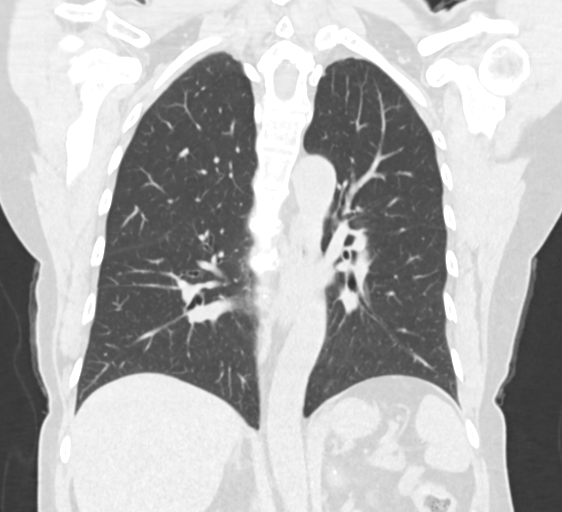

[13 of 36 positions shown; findings below may reference images not displayed]

FINDINGS: Cardiovascular: Mild mitral annular calcifications. Normal heart
size. No pericardial effusion.

Mediastinum/Nodes: No enlarged mediastinal or axillary lymph nodes.
Thyroid gland, trachea, and esophagus demonstrate no significant
findings.

Lungs/Pleura:

Lungs are clear. The tiny, incompletely visualized right lower lobe
subpleural pulmonary nodule on comparison CT is not visualized on
this study. No suspicious pulmonary nodules. No pleural effusion or
pneumothorax.

Upper Abdomen: No acute abnormality.

Musculoskeletal: No chest wall mass or suspicious bone lesions
identified. Postsurgical changes after right mastectomy.
IMPRESSION: No suspicious pulmonary nodules. The previously described finding is
not visualized on this study.

## 2023-03-12 ENCOUNTER — Encounter: Payer: Self-pay | Admitting: Hematology and Oncology

## 2023-04-28 NOTE — Assessment & Plan Note (Signed)
04/20/2018: Right mastectomy: DCIS 7 cm, margins negative, 0/2 lymph nodes negative, ER 100% strong, PR 95% strong Tis N0 stage 0   Recommendation: Patient could not tolerate adjuvant tamoxifen even at 5 mg dosage so it was discontinued. She has felt better after stopping tamoxifen therapy.   Breast cancer surveillance: Annual mammograms and breast exams 1. Mammogram on the left breast 03/07/2023 [facility]. Benign Density B  2. Breast exam 04/28/2023: Benign   Return to clinic on an as-needed basis.

## 2023-04-29 ENCOUNTER — Inpatient Hospital Stay: Payer: Medicare Other | Attending: Hematology and Oncology | Admitting: Hematology and Oncology

## 2023-04-29 ENCOUNTER — Other Ambulatory Visit: Payer: Self-pay

## 2023-04-29 VITALS — BP 159/75 | HR 105 | Temp 97.8°F | Resp 18 | Ht 70.0 in | Wt 145.1 lb

## 2023-04-29 DIAGNOSIS — Z86000 Personal history of in-situ neoplasm of breast: Secondary | ICD-10-CM | POA: Diagnosis present

## 2023-04-29 DIAGNOSIS — D0511 Intraductal carcinoma in situ of right breast: Secondary | ICD-10-CM | POA: Diagnosis not present

## 2023-04-29 DIAGNOSIS — Z9011 Acquired absence of right breast and nipple: Secondary | ICD-10-CM | POA: Diagnosis not present

## 2023-04-29 NOTE — Progress Notes (Signed)
Patient Care Team: Merri Brunette, MD as PCP - General (Internal Medicine) Glenna Fellows, MD (Inactive) as Consulting Physician (General Surgery) Serena Croissant, MD as Consulting Physician (Hematology and Oncology) Antony Blackbird, MD as Consulting Physician (Radiation Oncology)  DIAGNOSIS:  Encounter Diagnosis  Name Primary?   Ductal carcinoma in situ (DCIS) of right breast Yes    SUMMARY OF ONCOLOGIC HISTORY: Oncology History  Ductal carcinoma in situ (DCIS) of right breast  03/09/2018 Initial Diagnosis   Ductal carcinoma in situ (DCIS) of right breast   04/20/2018 Surgery   Right mastectomy: DCIS 7 cm, margins negative, 0/2 lymph nodes negative, ER 100% strong, PR 95% strong Tis N0 stage 0   05/27/2018 - 08/2018 Anti-estrogen oral therapy   Tamoxifen, 5mg  daily stopped for toxicities (felt lousy)     CHIEF COMPLIANT: History of DCIS  HISTORY OF PRESENT ILLNESS:   History of Present Illness   The patient, with a history of DCIS presents for follow up. She has foot issues, cataracts, and back problems, presents for a routine check-up. She reports overall good health, with all blood work falling within normal ranges as of her last check in October. The patient mentions foot issues that have been addressed by Ellamae Sia with inserts. She is scheduled for cataract surgery in February. The patient also reports a history of back problems dating back to the late seventies, including scoliosis. She has managed these issues with exercises over the years but reports that her foot is now numb, indicating possible nerve issues. She has an MRI scheduled to further investigate this issue. The patient leads an active lifestyle, including fencing and sailing, and is planning future activities such as a cross-country drive and a sailing trip.         ALLERGIES:  is allergic to silver, tape, nickel, and penicillins.  MEDICATIONS:  Current Outpatient Medications  Medication Sig Dispense  Refill   acetaminophen (TYLENOL) 500 MG tablet Take 2 tablets (1,000 mg total) by mouth every 8 (eight) hours. 30 tablet 0   amitriptyline (ELAVIL) 25 MG tablet Take 25 mg by mouth at bedtime as needed for sleep.      calcium carbonate (OSCAL) 1500 (600 Ca) MG TABS tablet Take 600 mg by mouth 2 (two) times daily with a meal.     Cholecalciferol (VITAMIN D3) 5000 units TBDP Take 10,000 Units by mouth daily.      cyclobenzaprine (FLEXERIL) 10 MG tablet Take 1 tablet by mouth at bedtime as needed.     ezetimibe (ZETIA) 10 MG tablet Take 1 tablet (10 mg total) by mouth daily.     Glucosamine-Chondroitin (GLUCOSAMINE CHONDR COMPLEX PO) Take by mouth.     levothyroxine (SYNTHROID) 50 MCG tablet Take 50 mcg by mouth every morning.     Multiple Vitamin (MULTIVITAMIN) tablet Take 1 tablet by mouth every morning.      VITAMIN A PO Take 2,400 mcg by mouth daily.      Current Facility-Administered Medications  Medication Dose Route Frequency Provider Last Rate Last Admin   0.9 %  sodium chloride infusion  500 mL Intravenous Once Nandigam, Kavitha V, MD        PHYSICAL EXAMINATION: ECOG PERFORMANCE STATUS: 1 - Symptomatic but completely ambulatory  Vitals:   04/29/23 0805  BP: (!) 159/75  Pulse: (!) 105  Resp: 18  Temp: 97.8 F (36.6 C)  SpO2: 99%   Filed Weights   04/29/23 0805  Weight: 145 lb 1.6 oz (65.8 kg)  Physical Exam no palpable lumps or nodules        (exam performed in the presence of a chaperone)  LABORATORY DATA:  I have reviewed the data as listed    Latest Ref Rng & Units 04/16/2018    8:28 AM 03/11/2018    8:08 AM 10/27/2017    3:44 PM  CMP  Glucose 70 - 99 mg/dL 409  90    BUN 8 - 23 mg/dL 11  13  9    Creatinine 0.44 - 1.00 mg/dL 8.11  9.14    Sodium 782 - 145 mmol/L 139  144    Potassium 3.5 - 5.1 mmol/L 3.7  3.9    Chloride 98 - 111 mmol/L 104  104    CO2 22 - 32 mmol/L 28  28    Calcium 8.9 - 10.3 mg/dL 9.2  9.2    Total Protein 6.5 - 8.1 g/dL 6.8  6.9     Total Bilirubin 0.3 - 1.2 mg/dL 0.5  0.2    Alkaline Phos 38 - 126 U/L 72  84    AST 15 - 41 U/L 22  17    ALT 0 - 44 U/L 17  12      Lab Results  Component Value Date   WBC 10.5 04/21/2018   HGB 11.7 (L) 04/21/2018   HCT 35.1 (L) 04/21/2018   MCV 93.6 04/21/2018   PLT 265 04/21/2018   NEUTROABS 3.4 03/11/2018    ASSESSMENT & PLAN:  Ductal carcinoma in situ (DCIS) of right breast 04/20/2018: Right mastectomy: DCIS 7 cm, margins negative, 0/2 lymph nodes negative, ER 100% strong, PR 95% strong Tis N0 stage 0   Recommendation: Patient could not tolerate adjuvant tamoxifen even at 5 mg dosage so it was discontinued. She has felt better after stopping tamoxifen therapy.   Breast cancer surveillance: Annual mammograms and breast exams 1. Mammogram on the left breast 03/07/2023 [facility]. Benign Density B  2. Breast exam 04/28/2023: Benign   Return to clinic on an as-needed basis.      No orders of the defined types were placed in this encounter.  The patient has a good understanding of the overall plan. she agrees with it. she will call with any problems that may develop before the next visit here. Total time spent: 30 mins including face to face time and time spent for planning, charting and co-ordination of care   Tamsen Meek, MD 04/29/23

## 2023-06-13 ENCOUNTER — Encounter: Payer: Self-pay | Admitting: Neurosurgery

## 2023-07-23 ENCOUNTER — Other Ambulatory Visit: Payer: Self-pay | Admitting: Neurosurgery

## 2023-07-23 DIAGNOSIS — N9489 Other specified conditions associated with female genital organs and menstrual cycle: Secondary | ICD-10-CM

## 2023-07-30 ENCOUNTER — Ambulatory Visit
Admission: RE | Admit: 2023-07-30 | Discharge: 2023-07-30 | Disposition: A | Discharge status: Home / Self Care | Payer: Medicare Other | Source: Ambulatory Visit | Attending: Neurosurgery | Admitting: Neurosurgery

## 2023-07-30 DIAGNOSIS — N9489 Other specified conditions associated with female genital organs and menstrual cycle: Secondary | ICD-10-CM

## 2023-08-27 ENCOUNTER — Other Ambulatory Visit: Payer: Self-pay | Admitting: Registered Nurse

## 2023-08-27 DIAGNOSIS — N83291 Other ovarian cyst, right side: Secondary | ICD-10-CM

## 2023-09-02 ENCOUNTER — Ambulatory Visit
Admission: RE | Admit: 2023-09-02 | Discharge: 2023-09-02 | Disposition: A | Discharge status: Home / Self Care | Source: Ambulatory Visit | Attending: Registered Nurse | Admitting: Registered Nurse

## 2023-09-02 DIAGNOSIS — N83291 Other ovarian cyst, right side: Secondary | ICD-10-CM

## 2023-09-02 MED ORDER — GADOPICLENOL 0.5 MMOL/ML IV SOLN
7.0000 mL | Freq: Once | INTRAVENOUS | Status: AC | PRN
  Administered 2023-09-02: 7 mL via INTRAVENOUS

## 2023-09-12 ENCOUNTER — Other Ambulatory Visit

## 2023-12-19 ENCOUNTER — Telehealth: Payer: Self-pay | Admitting: *Deleted

## 2023-12-19 NOTE — Telephone Encounter (Signed)
 Received call from pt stating her gynecologsit would like to start her on low does estradiol  cream to help with vaginal dryness and requesting advice from MD if okay to proceed.  Per MD okay to proceed with tx.  Pt educated and verbalized understanding.

## 2024-03-10 ENCOUNTER — Encounter: Payer: Self-pay | Admitting: Hematology and Oncology

## 2024-05-12 LAB — COLOGUARD: COLOGUARD: NEGATIVE

## 2024-06-02 ENCOUNTER — Emergency Department (HOSPITAL_BASED_OUTPATIENT_CLINIC_OR_DEPARTMENT_OTHER)
Admission: EM | Admit: 2024-06-02 | Discharge: 2024-06-02 | Disposition: A | Discharge status: Home / Self Care | Source: Ambulatory Visit | Attending: Emergency Medicine | Admitting: Emergency Medicine

## 2024-06-02 ENCOUNTER — Encounter (HOSPITAL_BASED_OUTPATIENT_CLINIC_OR_DEPARTMENT_OTHER): Payer: Self-pay

## 2024-06-02 ENCOUNTER — Other Ambulatory Visit: Payer: Self-pay

## 2024-06-02 DIAGNOSIS — R220 Localized swelling, mass and lump, head: Secondary | ICD-10-CM | POA: Diagnosis present

## 2024-06-02 NOTE — Discharge Instructions (Signed)
 I am concerned that you may have potential sialoadenitis which is inflammation and sometimes infection of the gland in your cheek.  Often swelling occurs after meals as the gland is trying to secrete enzymes to digest food in your mouth.  At this time not concern for infection given how well you appear without fever but recommend sour candies and massage of your right cheek intermittently for the next couple days and follow-up your PCP.  If your symptoms worsen, you have persistent facial swelling, develop a fever, facial pain or any other concerning symptom please return to ED for further evaluation.

## 2024-06-02 NOTE — ED Triage Notes (Signed)
 Pt reports right sided facial swelling that started last night at 7pm. Progressively worsened throughout today and accompanied with irritation in jaw when eating.

## 2024-06-02 NOTE — ED Provider Notes (Signed)
 " [place] EMERGENCY DEPARTMENT AT [facility] Provider Note   CSN: [id number] Arrival date & time: 06/02/24  1437     Patient presents with: Facial Swelling  HPI Jodi Gutierrez is a 74 y.o. female present for facial swelling.  Occurred twice since last night.  She states it happened both times after meals.  Swelling is in the right upper cheek and happen once after dinner last night lasted about an hour and again after lunch today and lasted for an hour.  She came here for further evaluation.  She denies any throat closing sensation or trouble breathing.  Denies fever or sore throat.  Spoke to her PCP who advised her to come here.   HPI     Prior to Admission medications  Medication Sig Start Date End Date Taking? Authorizing Provider  acetaminophen  (TYLENOL ) 500 MG tablet Take 2 tablets (1,000 mg total) by mouth every 8 (eight) hours. 03/26/16   Danella Cough, PA-C  amitriptyline  (ELAVIL ) 25 MG tablet Take 25 mg by mouth at bedtime as needed for sleep.     [provider]  calcium carbonate (OSCAL) 1500 (600 Ca) MG TABS tablet Take 600 mg by mouth 2 (two) times daily with a meal.    [provider]  Cholecalciferol (VITAMIN D3) 5000 units TBDP Take 10,000 Units by mouth daily.     [provider]  cyclobenzaprine (FLEXERIL) 10 MG tablet Take 1 tablet by mouth at bedtime as needed. 10/05/20   [provider]  ezetimibe  (ZETIA ) 10 MG tablet Take 1 tablet (10 mg total) by mouth daily. 08/03/20   Gudena, Vinay, MD  Glucosamine-Chondroitin (GLUCOSAMINE CHONDR COMPLEX PO) Take by mouth.    [provider]  levothyroxine  (SYNTHROID ) 50 MCG tablet Take 50 mcg by mouth every morning. 10/05/20   [provider]  Multiple Vitamin (MULTIVITAMIN) tablet Take 1 tablet by mouth every morning.     [provider]  VITAMIN A PO Take 2,400 mcg by mouth daily.     [provider]    Allergies: Silver, Tape, Nickel, and  Penicillins    Review of Systems See HPI Updated Vital Signs BP (!) 143/68 (BP Location: Left Arm)   Pulse 93   Temp 97.9 F (36.6 C) (Oral)   Resp 18   Ht 5' 10 (1.778 m)   Wt 65.8 kg   SpO2 95%   BMI 20.81 kg/m   Physical Exam Constitutional:      Appearance: Normal appearance.  HENT:     Head: Normocephalic.     Comments: No facial swelling.  No erythema or edema or tenderness with palpation of the face.    Nose: Nose normal.     Mouth/Throat:     Lips: Pink.     Mouth: Mucous membranes are moist.     Tongue: No lesions. Tongue does not deviate from midline.     Palate: No mass and lesions.     Pharynx: Oropharynx is clear. Uvula midline.     Tonsils: No tonsillar exudate or tonsillar abscesses.  Eyes:     Conjunctiva/sclera: Conjunctivae normal.  Pulmonary:     Effort: Pulmonary effort is normal.  Musculoskeletal:     Cervical back: Full passive range of motion without pain, normal range of motion and neck supple. No edema, erythema or rigidity. Normal range of motion.  Lymphadenopathy:     Cervical: No cervical adenopathy.  Neurological:     Mental Status: She is alert.  Psychiatric:  Mood and Affect: Mood normal.     (all labs ordered are listed, but only abnormal results are displayed) Labs Reviewed - No data to display  EKG: None  Radiology: No results found.   Procedures   Medications Ordered in the ED - No data to display                                  Medical Decision Making  74 year old well-appearing female presenting for transient facial swelling.  Exam was unremarkable and there was no swelling or abnormality noted in her mouth face or neck.  She did show me a picture from earlier today which there was significant swelling in the right upper cheek.  Suspect this could be a form of sialoadenitis given that she is having swelling after meals.  She looks well and is afebrile.  Advised for now sour candy and to follow-up with her  PCP.  Also mention that if she develops a fever or persistent facial swelling and pain she should return for further evaluation.  At this time there is no suggestions of associated infection.  Dicharged in good condition.     Final diagnoses:  Facial swelling    ED Discharge Orders     None          Lang Norleen MARLA DEVONNA 06/02/24 1633    Dreama Longs, MD 06/03/24 1218  "
